# Patient Record
Sex: Female | Born: 1985 | Race: Asian | Hispanic: No | Marital: Married | Smoking: Never smoker
Health system: Southern US, Community
[De-identification: ages and names within clinical notes are randomized; demographics above are authoritative.]

## PROBLEM LIST (undated history)

## (undated) DIAGNOSIS — N83209 Unspecified ovarian cyst, unspecified side: Secondary | ICD-10-CM

## (undated) DIAGNOSIS — F419 Anxiety disorder, unspecified: Secondary | ICD-10-CM

## (undated) HISTORY — PX: NO PAST SURGERIES: SHX2092

## (undated) HISTORY — DX: Unspecified ovarian cyst, unspecified side: N83.209

---
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
## 2007-08-22 ENCOUNTER — Emergency Department (HOSPITAL_COMMUNITY): Admission: EM | Admit: 2007-08-22 | Discharge: 2007-08-22 | Payer: Self-pay | Admitting: Emergency Medicine

## 2010-05-21 ENCOUNTER — Inpatient Hospital Stay (HOSPITAL_COMMUNITY)
Admission: AD | Admit: 2010-05-21 | Discharge: 2010-05-21 | Disposition: A | Discharge status: Home / Self Care | Payer: Medicaid Other | Source: Ambulatory Visit | Attending: Obstetrics and Gynecology | Admitting: Obstetrics and Gynecology

## 2010-05-21 DIAGNOSIS — O4700 False labor before 37 completed weeks of gestation, unspecified trimester: Secondary | ICD-10-CM | POA: Insufficient documentation

## 2010-05-21 DIAGNOSIS — R109 Unspecified abdominal pain: Secondary | ICD-10-CM | POA: Insufficient documentation

## 2010-05-21 LAB — URINALYSIS, ROUTINE W REFLEX MICROSCOPIC
Glucose, UA: NEGATIVE mg/dL
Ketones, ur: NEGATIVE mg/dL
Nitrite: NEGATIVE
Specific Gravity, Urine: 1.02 (ref 1.005–1.030)
pH: 7 (ref 5.0–8.0)

## 2010-06-18 ENCOUNTER — Inpatient Hospital Stay (HOSPITAL_COMMUNITY)
Admission: AD | Admit: 2010-06-18 | Discharge: 2010-06-20 | DRG: 775 | Disposition: A | Discharge status: Home / Self Care | Payer: Medicaid Other | Source: Ambulatory Visit | Attending: Obstetrics and Gynecology | Admitting: Obstetrics and Gynecology

## 2010-06-18 DIAGNOSIS — Z22330 Carrier of Group B streptococcus: Secondary | ICD-10-CM

## 2010-06-18 DIAGNOSIS — O99892 Other specified diseases and conditions complicating childbirth: Secondary | ICD-10-CM | POA: Diagnosis present

## 2010-06-18 LAB — CBC
HCT: 36.3 % (ref 36.0–46.0)
Hemoglobin: 12.2 g/dL (ref 12.0–15.0)
MCHC: 33.6 g/dL (ref 30.0–36.0)
RBC: 4.11 MIL/uL (ref 3.87–5.11)
WBC: 10.9 10*3/uL — ABNORMAL HIGH (ref 4.0–10.5)

## 2010-06-19 LAB — RPR: RPR Ser Ql: NONREACTIVE

## 2010-06-20 LAB — CBC
HCT: 31.7 % — ABNORMAL LOW (ref 36.0–46.0)
RBC: 3.59 MIL/uL — ABNORMAL LOW (ref 3.87–5.11)
RDW: 13.6 % (ref 11.5–15.5)
WBC: 11.3 10*3/uL — ABNORMAL HIGH (ref 4.0–10.5)

## 2010-06-25 NOTE — Discharge Summary (Signed)
  NAME:  Jamie Ponce, Jamie Ponce                  ACCOUNT NO.:  [id number]  MEDICAL RECORD NO.:  [id number]           PATIENT TYPE:  I  LOCATION:  9146                          FACILITY:  [facility]  PHYSICIAN:  Huel Cote, M.D. DATE OF BIRTH:  [birth year]  DATE OF ADMISSION:  06/18/2010 DATE OF DISCHARGE:  06/20/2010                              DISCHARGE SUMMARY   DISCHARGE DIAGNOSES: 1. Term pregnancy at 38-3/7 weeks delivered. 2. Status post normal spontaneous vaginal delivery. 3. Group B strep positive status treated in labor.  DISCHARGE FOLLOWUP:  The patient is to follow up in the office in 6 weeks for her full postpartum exam.  DISCHARGE MEDICATIONS: 1. Motrin 600 mg p.o. every 6 hours. 2. Prenatal vitamins 1 p.o. daily.  HOSPITAL COURSE:  The patient is a 25 year old G2, P1-0-0-1 who came in at 38-3/[redacted] weeks gestation with a complaint of contraction and cervical change from 3 cm to 5 cm throughout the day today.  She had no rupture of membranes and prenatal care was significant for transfer Claris Gower to [place] in her third trimester.  She also had a positive group B strep status.  Prenatal labs are as follows:  AB+, antibody negative, rubella immune, hepatitis B surface antigen negative, HIV negative, RPR nonreactive, hemoglobin AA, cystic fibrosis negative, quad screen negative, 1-hour Glucola 110, group B strep positive.  PAST OBSTETRICAL HISTORY:  In 2006 she had a vaginal delivery of an 8 pound infant.  PAST MEDICAL HISTORY:  None.  PAST SURGICAL HISTORY:  None.  PAST GYN HISTORY:  No abnormal Pap smears.  ALLERGIES:  None.  MEDICATIONS:  Prenatal vitamins.  She is single with the father of the baby involved.  Does not use tobacco, alcohol, or drugs.  On admission she was afebrile with stable vital signs.  Cervix was 75 to 6 cm in -1 station.  After she received penicillin for her group B strep positive status, she had rupture of membranes with clear fluid noted. She then  progressed on to complete dilation and pushed well with a normal spontaneous vaginal delivery of a vigorous female infant over a first-degree laceration.  Nuchal cord was delivered through.  Apgar's were 8 and 9, weight 6 pounds 15 ounces.  Placenta delivery was spontaneous.  First-degree laceration was repaired with 3-0 Vicryl for hemostasis.  Cervix and rectum were intact.  She was admitted for routine postpartum care.  On postpartum day #1, hemoglobin was 10.4. She was doing well and taking Motrin for her pain and having no issues. Fundus was firm.  Bleeding was normal.  She requested early discharge and this was granted as baby was doing well and the pediatricians agreed.  She then was given instructions on pelvic rest and to follow up in the office in 6 weeks. She plans to do the circumcision in the office.     Huel Cote, M.D.     KR/MEDQ  D:  06/20/2010  T:  06/20/2010  Job:  [id number]  Electronically Signed by Huel Cote M.D. on 06/25/2010 08:50:59 AM

## 2010-06-29 NOTE — Op Note (Signed)
   NAME:  Jamie Ponce, Jamie Ponce                         ACCOUNT NO.:  [id number]   MEDICAL RECORD NO.:  [id number]                   PATIENT TYPE:  INP   LOCATION:  9133                                 [facility]ACILITY:  [facility]   PHYSICIAN:  Georgina Peer, M.D.              DATE OF BIRTH:  1985/07/24   DATE OF PROCEDURE:  11/06/2001  DATE OF DISCHARGE:                                 OPERATIVE REPORT   DIAGNOSES:  Maternal fatigue, third stage, vertex crowning.   POST DELIVERY DIAGNOSES:  Maternal fatigue, third stage, vertex crowning,  second degree episiotomy, third degree laceration, and mild postpartum  hemorrhage.   OPERATION:  Operative delivery by KIWI vacuum extraction.  Delivered viable  female infant at 6:13 a.m.  Apgars of 9 and 9.   ANESTHESIA:  Epidural.   INDICATIONS:  A 25 year old primigravida presented in labor.  Became  completely dilated at 3:45 and began pushing.  Perineum became edematous as  well did the periurethral area and the patient became fatigued and was not  making any more progress.  The vertex could be seen about crowning about .50  pea size.  Vacuum extraction was offered and accepted.  Risks and  complications including caput accentuation, scalp laceration,  cephalohematoma, and intracranial hemorrhage all discussed and accepted.  KIWI vacuum was applied and within three contractions a vaginal delivery was  accomplished over a second degree midline episiotomy.  At 6:13 a viable female  infant was delivered.  Mouth and nose were suctioned.  Infant cried  spontaneously and Apgars were 9 and 9.  Placenta spontaneously delivered at  6:17.  There was noted to be a third degree extension, but no extension of  the rectal mucosa.  The perineum was repaired in layers with four mattress  sutures placed circumferentially around the anal sphincter.  There was some  mild uterine atone which responded to Pitocin, massage, and Methergine.  Blood loss was estimated at 500 cc.  The  patient was stable, so was the  infant postpartum.  Sponge, needle, and instrument counts were correct.                                               Georgina Peer, M.D.    JPN/MEDQ  D:  11/06/2001  T:  11/06/2001  Job:  [id number]

## 2013-02-12 ENCOUNTER — Ambulatory Visit: Payer: Self-pay

## 2013-02-12 ENCOUNTER — Encounter (HOSPITAL_COMMUNITY): Payer: Self-pay | Admitting: Emergency Medicine

## 2013-02-12 ENCOUNTER — Emergency Department (HOSPITAL_COMMUNITY)
Admission: EM | Admit: 2013-02-12 | Discharge: 2013-02-12 | Disposition: A | Discharge status: Home / Self Care | Payer: Medicaid Other | Attending: Emergency Medicine | Admitting: Emergency Medicine

## 2013-02-12 DIAGNOSIS — R1030 Lower abdominal pain, unspecified: Secondary | ICD-10-CM

## 2013-02-12 DIAGNOSIS — R300 Dysuria: Secondary | ICD-10-CM | POA: Insufficient documentation

## 2013-02-12 DIAGNOSIS — R1032 Left lower quadrant pain: Secondary | ICD-10-CM | POA: Insufficient documentation

## 2013-02-12 DIAGNOSIS — Z3202 Encounter for pregnancy test, result negative: Secondary | ICD-10-CM | POA: Insufficient documentation

## 2013-02-12 DIAGNOSIS — N949 Unspecified condition associated with female genital organs and menstrual cycle: Secondary | ICD-10-CM | POA: Insufficient documentation

## 2013-02-12 LAB — URINALYSIS, ROUTINE W REFLEX MICROSCOPIC
Bilirubin Urine: NEGATIVE
Glucose, UA: NEGATIVE mg/dL
Hgb urine dipstick: NEGATIVE
Ketones, ur: NEGATIVE mg/dL
Leukocytes, UA: NEGATIVE
Nitrite: NEGATIVE
Protein, ur: NEGATIVE mg/dL
Specific Gravity, Urine: 1.011 (ref 1.005–1.030)
Urobilinogen, UA: 0.2 mg/dL (ref 0.0–1.0)
pH: 7.5 (ref 5.0–8.0)

## 2013-02-12 LAB — WET PREP, GENITAL
Clue Cells Wet Prep HPF POC: NONE SEEN
Trich, Wet Prep: NONE SEEN
Yeast Wet Prep HPF POC: NONE SEEN

## 2013-02-12 LAB — PREGNANCY, URINE: Preg Test, Ur: NEGATIVE

## 2013-02-12 MED ORDER — TRAMADOL HCL 50 MG PO TABS: 50.0000 mg | ORAL_TABLET | Freq: Four times a day (QID) | ORAL | Status: DC | PRN

## 2013-02-12 NOTE — Progress Notes (Signed)
   CARE MANAGEMENT ED NOTE 02/12/2013  Patient:  Jamie Ponce,Jamie Ponce   Account Number:  [id number]  Date Initiated:  02/12/2013  Documentation initiated by:  Radford PaxFERRERO,Jahmil Macleod  Subjective/Objective Assessment:   Patient presents to ED with suprapubic pain.     Subjective/Objective Assessment Detail:     Action/Plan:   Action/Plan Detail:   Anticipated DC Date:  02/12/2013     Status Recommendation to Physician:   Result of Recommendation:    Other ED Services  Consult Working Plan    DC Planning Services  Other  Outpatient Services - Pt will follow up    Choice offered to / List presented to:            Status of service:  Completed, signed off  ED Comments:   ED Comments Detail:  EDCM spoke to patient at bedside.  Patient confirms she does not have a pcp or insurance.  [facility] provided [facility] with a list of pcps who accept self pay patients, list of discount pharmacies and website needymeds.org for medication assistance, financial assistance in the community sucha s local churches and [employer], urban ministries, information regarding Medicaid and affordable care act for insurance, and dental assistance for uninsured patients.  Patient thankful for resources.  No further CM needs at this time.

## 2013-02-12 NOTE — ED Notes (Addendum)
Pt reports pain w/ urination and lower abdominal pain with activity.   Pt is aware that we need a urine sample.

## 2013-02-12 NOTE — ED Provider Notes (Signed)
CSN: [id number]     Arrival date & time 02/12/13  1337 History   First MD Initiated Contact with Patient 02/12/13 1400     Chief Complaint  Patient presents with  . Abdominal Pain   (Consider location/radiation/quality/duration/timing/severity/associated sxs/prior Treatment) HPI Pt is a 27yo female with suprapubic pain that started yesterday. Pain is cramping and sharp in nature, intermittent, 8/10 at worst. Pain does not radiate. Reports mild dysuria and pressure sensation with urination. Has tried warm compress to lower abdomen w/o relief. Has not taken any pain medication. Denies fever, n/v/d. Denies vaginal pain or discharge. LMP: 12/20. Normal per pt. Pt is not currently sexually active. Not on birth control. No hx of abdominal surgeries.   History reviewed. No pertinent past medical history. History reviewed. No pertinent past surgical history. No family history on file. History  Substance Use Topics  . Smoking status: Never Smoker   . Smokeless tobacco: Not on file  . Alcohol Use: No   OB History   Grav Para Term Preterm Abortions TAB SAB Ect Mult Living                 Review of Systems  Constitutional: Negative for fever and chills.  Gastrointestinal: Positive for abdominal pain ( lower abdomen). Negative for nausea, vomiting and diarrhea.  Genitourinary: Positive for dysuria and pelvic pain. Negative for urgency, hematuria, flank pain, decreased urine volume, vaginal bleeding, vaginal discharge, genital sores and vaginal pain.  All other systems reviewed and are negative.    Allergies  Review of patient's allergies indicates no known allergies.  Home Medications   Current Outpatient Rx  Name  Route  Sig  Dispense  Refill  . traMADol (ULTRAM) 50 MG tablet   Oral   Take 1 tablet (50 mg total) by mouth every 6 (six) hours as needed.   15 tablet   0    BP 103/60  Pulse 66  Temp(Src) 98.4 F (36.9 C) (Oral)  SpO2 98%  LMP 01/30/2013 Physical Exam  Nursing  note and vitals reviewed. Constitutional: She appears well-developed and well-nourished. No distress.  Pt lying comfortably in exam bed, NAD.   HENT:  Head: Normocephalic and atraumatic.  Eyes: Conjunctivae are normal. No scleral icterus.  Neck: Normal range of motion.  Cardiovascular: Normal rate, regular rhythm and normal heart sounds.   Pulmonary/Chest: Effort normal and breath sounds normal. No respiratory distress. She has no wheezes. She has no rales. She exhibits no tenderness.  Abdominal: Soft. Bowel sounds are normal. She exhibits no distension and no mass. There is tenderness (suprapubic and LLQ). There is no rebound and no guarding.    Genitourinary: Uterus normal. No labial fusion. There is no rash, tenderness, lesion or injury on the right labia. There is no rash, tenderness, lesion or injury on the left labia. Cervix exhibits no motion tenderness, no discharge and no friability. Right adnexum displays no mass, no tenderness and no fullness. Left adnexum displays no mass, no tenderness and no fullness. There is bleeding ( scant red blood) around the vagina. No erythema or tenderness around the vagina. No foreign body around the vagina. No signs of injury around the vagina. No vaginal discharge found.  Scant red blood in vaginal canal. No CMT, adnexal tenderness or masses.  Musculoskeletal: Normal range of motion.  Neurological: She is alert.  Skin: Skin is warm and dry. She is not diaphoretic.    ED Course  Procedures (including critical care time) Labs Review Labs Reviewed  WET  PREP, GENITAL - Abnormal; Notable for the following:    WBC, Wet Prep HPF POC FEW (*)    All other components within normal limits  GC/CHLAMYDIA PROBE AMP  URINALYSIS, ROUTINE W REFLEX MICROSCOPIC  PREGNANCY, URINE   Imaging Review No results found.  EKG Interpretation   None       MDM   1. Lower abdominal pain    Pt c/o lower abdominal pain. On exam, pt appears well, non-toxic. NAD.  Abd: soft, nondistended, mild tenderness in pelvic, LLQ region.  No rebound or guarding. No masses. Pelvic: scant red blood in vaginal canal. No CMT, adnexal tenderness or masses.  Pt declined pain medication in ED.  UA: WNL Urine preg: negative Wet prep: unremarkable.  Not concerned for emergent process taking place at this time.  Discussed findings with pt. [facility] and tx of symptoms. Return precautions provided. Rx: tramadol. Pt verbalized understanding and agreement with tx plan.    Junius FinnerErin O'Malley, PA-C 02/12/13 [phone]

## 2013-02-12 NOTE — ED Notes (Signed)
Pelvic setup in room 

## 2013-02-12 NOTE — ED Notes (Signed)
Pt c/o lower abd pain that "when i walk feels like something is poking me".  Pt denies n/v/d.

## 2013-02-14 LAB — GC/CHLAMYDIA PROBE AMP
CT Probe RNA: NEGATIVE
GC Probe RNA: NEGATIVE

## 2013-02-15 NOTE — ED Provider Notes (Signed)
Medical screening examination/treatment/procedure(s) were performed by non-physician practitioner and as supervising physician I was immediately available for consultation/collaboration.  EKG Interpretation   None         Estelita Iten L Mishayla Sliwinski, MD 02/15/13 0735 

## 2013-02-16 ENCOUNTER — Inpatient Hospital Stay (HOSPITAL_COMMUNITY)
Admission: AD | Admit: 2013-02-16 | Discharge: 2013-02-16 | Disposition: A | Discharge status: Home / Self Care | Payer: Medicaid Other | Source: Ambulatory Visit | Attending: Obstetrics and Gynecology | Admitting: Obstetrics and Gynecology

## 2013-02-16 ENCOUNTER — Encounter (HOSPITAL_COMMUNITY): Payer: Self-pay | Admitting: *Deleted

## 2013-02-16 ENCOUNTER — Inpatient Hospital Stay (HOSPITAL_COMMUNITY): Payer: Medicaid Other

## 2013-02-16 DIAGNOSIS — N83209 Unspecified ovarian cyst, unspecified side: Secondary | ICD-10-CM

## 2013-02-16 DIAGNOSIS — N83201 Unspecified ovarian cyst, right side: Secondary | ICD-10-CM

## 2013-02-16 DIAGNOSIS — R42 Dizziness and giddiness: Secondary | ICD-10-CM | POA: Insufficient documentation

## 2013-02-16 DIAGNOSIS — R109 Unspecified abdominal pain: Secondary | ICD-10-CM | POA: Insufficient documentation

## 2013-02-16 LAB — URINALYSIS, ROUTINE W REFLEX MICROSCOPIC
BILIRUBIN URINE: NEGATIVE
Glucose, UA: NEGATIVE mg/dL
KETONES UR: 15 mg/dL — AB
LEUKOCYTES UA: NEGATIVE
NITRITE: NEGATIVE
PH: 6 (ref 5.0–8.0)
PROTEIN: NEGATIVE mg/dL
Specific Gravity, Urine: 1.025 (ref 1.005–1.030)
UROBILINOGEN UA: 0.2 mg/dL (ref 0.0–1.0)

## 2013-02-16 LAB — CBC
HCT: 35.1 % — ABNORMAL LOW (ref 36.0–46.0)
HEMOGLOBIN: 12.1 g/dL (ref 12.0–15.0)
MCH: 29.6 pg (ref 26.0–34.0)
MCHC: 34.5 g/dL (ref 30.0–36.0)
MCV: 85.8 fL (ref 78.0–100.0)
PLATELETS: 199 10*3/uL (ref 150–400)
RBC: 4.09 MIL/uL (ref 3.87–5.11)
RDW: 12.2 % (ref 11.5–15.5)
WBC: 6.5 10*3/uL (ref 4.0–10.5)

## 2013-02-16 LAB — URINE MICROSCOPIC-ADD ON

## 2013-02-16 LAB — POCT PREGNANCY, URINE: PREG TEST UR: NEGATIVE

## 2013-02-16 MED ORDER — IBUPROFEN 600 MG PO TABS: 600.0000 mg | ORAL_TABLET | Freq: Four times a day (QID) | ORAL | Status: DC | PRN

## 2013-02-16 MED ORDER — KETOROLAC TROMETHAMINE 60 MG/2ML IM SOLN
60.0000 mg | Freq: Once | INTRAMUSCULAR | Status: AC
  Administered 2013-02-16: 60 mg via INTRAMUSCULAR
  Filled 2013-02-16: qty 2

## 2013-02-16 MED ORDER — HYDROCODONE-ACETAMINOPHEN 5-325 MG PO TABS: 1.0000 | ORAL_TABLET | ORAL | Status: DC | PRN

## 2013-02-16 NOTE — MAU Note (Signed)
Patient states she has had lower abdominal pain since 1-1. [facility] Patient states she called and could not be seen until February and states she continues to have pain. States pain medication is not working. Denies bleeding or discharge.

## 2013-02-16 NOTE — MAU Provider Note (Signed)
History     CSN: [id number]  Arrival date and time: 02/16/13 1016   First Provider Initiated Contact with Patient 02/16/13 1448      Chief Complaint  Patient presents with  . Abdominal Pain   HPI  [name] is a 28 y.o. female G15P2002. Pt was seen at [facility] ED on 02/13/2012; was referred to [facility]. Pt called to schedule an appointment in the clinic and was told she would not be seen until February. She presents today with abdominal pain that is worsened with walking. She is also experiencing dizziness, nausea and "feeling hot".  The pain first started on 02/13/12 and has gotten a little better, however has not gone away. Pain is described at sharp, mid-left abdominal pain that is worse when walking. She has not taken anything for pain today; she currently rates her pain 8 when she is walking.   OB History   Grav Para Term Preterm Abortions TAB SAB Ect Mult Living   2 2 2       2       History reviewed. No pertinent past medical history.  Past Surgical History  Procedure Laterality Date  . Vaginal delivery      X 2    Family History  Problem Relation Age of Onset  . Heart disease Mother   . Diabetes Father     History  Substance Use Topics  . Smoking status: Never Smoker   . Smokeless tobacco: Never Used  . Alcohol Use: No    Allergies: No Known Allergies  Prescriptions prior to admission  Medication Sig Dispense Refill  . traMADol (ULTRAM) 50 MG tablet Take 1 tablet (50 mg total) by mouth every 6 (six) hours as needed.  15 tablet  0   Results for orders placed during the hospital encounter of 02/16/13 (from the past 48 hour(s))  URINALYSIS, ROUTINE W REFLEX MICROSCOPIC     Status: Abnormal   Collection Time    02/16/13  2:10 PM      Result Value Range   Color, Urine YELLOW  YELLOW   APPearance CLEAR  CLEAR   Specific Gravity, Urine 1.025  1.005 - 1.030   pH 6.0  5.0 - 8.0   Glucose, UA NEGATIVE  NEGATIVE mg/dL   Hgb urine dipstick TRACE (*)  NEGATIVE   Bilirubin Urine NEGATIVE  NEGATIVE   Ketones, ur 15 (*) NEGATIVE mg/dL   Protein, ur NEGATIVE  NEGATIVE mg/dL   Urobilinogen, UA 0.2  0.0 - 1.0 mg/dL   Nitrite NEGATIVE  NEGATIVE   Leukocytes, UA NEGATIVE  NEGATIVE  URINE MICROSCOPIC-ADD ON     Status: None   Collection Time    02/16/13  2:10 PM      Result Value Range   Squamous Epithelial / LPF RARE  RARE   WBC, UA 0-2  <3 WBC/hpf   RBC / HPF 0-2  <3 RBC/hpf  CBC     Status: Abnormal   Collection Time    02/16/13  2:48 PM      Result Value Range   WBC 6.5  4.0 - 10.5 K/uL   RBC 4.09  3.87 - 5.11 MIL/uL   Hemoglobin 12.1  12.0 - 15.0 g/dL   HCT 40.9 (*) 81.1 - 91.4 %   MCV 85.8  78.0 - 100.0 fL   MCH 29.6  26.0 - 34.0 pg   MCHC 34.5  30.0 - 36.0 g/dL   RDW 78.2  95.6 - 21.3 %  Platelets 199  150 - 400 K/uL  POCT PREGNANCY, URINE     Status: None   Collection Time    02/16/13  2:52 PM      Result Value Range   Preg Test, Ur NEGATIVE  NEGATIVE   Comment:            THE SENSITIVITY OF THIS     METHODOLOGY IS >24 mIU/mL   US Transvaginal Non-ob  02/16/2013   CLINICAL DATA:  Severe lower abdominal pain. Clinical suspicion for ovarian torsion.  EXAM: TRANSABDOMINAL AND TRANSVAGINAL ULTRASOUND OF PELVIS  DOPPLER ULTRASOUND OF OVARIES  TECHNIQUE: Both transabdominal and transvaginal ultrasound examinations of the pelvis were performed. Transabdominal technique was performed for global imaging of the pelvis including uterus, ovaries, adnexal regions, and pelvic cul-de-sac.  It was necessary to proceed with endovaginal exam following the transabdominal exam to visualize the endometrium and ovaries. Color and duplex Doppler ultrasound was utilized to evaluate blood flow to the ovaries.  COMPARISON:  None.  FINDINGS: Uterus  Measurements: 6.9 x 3.7 x 4.6 cm. Retroflexed. No fibroids or other mass visualized.  Endometrium  Thickness: 12 mm.  No focal abnormality visualized.  Right ovary  Measurements: 4.2 x 2.2 x 4.8 cm. A 2 cm  corpus luteum noted. Otherwise normal appearance/no adnexal mass.  Left ovary  Measurements: 3.1 x 1.9 x 1.1 cm. Normal appearance/no adnexal mass.  Pulsed Doppler evaluation of both ovaries demonstrates normal low-resistance arterial and venous waveforms.  Other findings  Small amount of complex fluid noted in cul-de-sac and right adnexa.  IMPRESSION: Small right ovarian corpus luteum and small amount of free fluid, likely physiologic.  No sonographic evidence for ovarian torsion.   Electronically Signed   By: Myles Rosenthal M.D.   On: 02/16/2013 16:20   US Pelvis Complete  02/16/2013   CLINICAL DATA:  Severe lower abdominal pain. Clinical suspicion for ovarian torsion.  EXAM: TRANSABDOMINAL AND TRANSVAGINAL ULTRASOUND OF PELVIS  DOPPLER ULTRASOUND OF OVARIES  TECHNIQUE: Both transabdominal and transvaginal ultrasound examinations of the pelvis were performed. Transabdominal technique was performed for global imaging of the pelvis including uterus, ovaries, adnexal regions, and pelvic cul-de-sac.  It was necessary to proceed with endovaginal exam following the transabdominal exam to visualize the endometrium and ovaries. Color and duplex Doppler ultrasound was utilized to evaluate blood flow to the ovaries.  COMPARISON:  None.  FINDINGS: Uterus  Measurements: 6.9 x 3.7 x 4.6 cm. Retroflexed. No fibroids or other mass visualized.  Endometrium  Thickness: 12 mm.  No focal abnormality visualized.  Right ovary  Measurements: 4.2 x 2.2 x 4.8 cm. A 2 cm corpus luteum noted. Otherwise normal appearance/no adnexal mass.  Left ovary  Measurements: 3.1 x 1.9 x 1.1 cm. Normal appearance/no adnexal mass.  Pulsed Doppler evaluation of both ovaries demonstrates normal low-resistance arterial and venous waveforms.  Other findings  Small amount of complex fluid noted in cul-de-sac and right adnexa.  IMPRESSION: Small right ovarian corpus luteum and small amount of free fluid, likely physiologic.  No sonographic evidence for ovarian  torsion.   Electronically Signed   By: Myles Rosenthal M.D.   On: 02/16/2013 16:20   [place] Art/ven Flow Abd Pelv Doppler  02/16/2013   CLINICAL DATA:  Severe lower abdominal pain. Clinical suspicion for ovarian torsion.  EXAM: TRANSABDOMINAL AND TRANSVAGINAL ULTRASOUND OF PELVIS  DOPPLER ULTRASOUND OF OVARIES  TECHNIQUE: Both transabdominal and transvaginal ultrasound examinations of the pelvis were performed. Transabdominal technique was performed for global imaging of  the pelvis including uterus, ovaries, adnexal regions, and pelvic cul-de-sac.  It was necessary to proceed with endovaginal exam following the transabdominal exam to visualize the endometrium and ovaries. Color and duplex Doppler ultrasound was utilized to evaluate blood flow to the ovaries.  COMPARISON:  None.  FINDINGS: Uterus  Measurements: 6.9 x 3.7 x 4.6 cm. Retroflexed. No fibroids or other mass visualized.  Endometrium  Thickness: 12 mm.  No focal abnormality visualized.  Right ovary  Measurements: 4.2 x 2.2 x 4.8 cm. A 2 cm corpus luteum noted. Otherwise normal appearance/no adnexal mass.  Left ovary  Measurements: 3.1 x 1.9 x 1.1 cm. Normal appearance/no adnexal mass.  Pulsed Doppler evaluation of both ovaries demonstrates normal low-resistance arterial and venous waveforms.  Other findings  Small amount of complex fluid noted in cul-de-sac and right adnexa.  IMPRESSION: Small right ovarian corpus luteum and small amount of free fluid, likely physiologic.  No sonographic evidence for ovarian torsion.   Electronically Signed   By: Myles RosenthalJohn  Stahl M.D.   On: 02/16/2013 16:20    Review of Systems  Constitutional: Positive for chills. Negative for fever.  Gastrointestinal: Positive for nausea, abdominal pain and diarrhea. Negative for vomiting.       Yesterday patient had 2 episodes of diarrhea.   Genitourinary: Negative for dysuria, urgency, frequency and hematuria.  Musculoskeletal: Positive for back pain.   Physical Exam   Temperature 98.7  F (37.1 C), height 4\' 10"  (1.473 m), weight 46.448 kg (102 lb 6.4 oz), last menstrual period 01/23/2013.  Physical Exam  Constitutional: She is oriented to person, place, and time. She appears well-developed and well-nourished. No distress.  HENT:  Head: Normocephalic.  Eyes: Pupils are equal, round, and reactive to light.  Neck: Neck supple.  Respiratory: Effort normal.  GI: Soft. She exhibits no distension. There is tenderness in the right upper quadrant and right lower quadrant. There is no rebound and no guarding.  Genitourinary:  Bimanual exam: Cervix closed, Mild CMT  Uterus non tender, slightly enlarged uterus  Bilateral adnexal tenderness, + suprapubic tenderness no masses bilaterally Chaperone present for exam.   Musculoskeletal: Normal range of motion.  Neurological: She is alert and oriented to person, place, and time.  Skin: Skin is warm. She is not diaphoretic.    MAU Course  Procedures None  MDM CBC  Toradol 60 mg IM Pelvic US UA   Assessment and Plan   A:  1. Ovarian cyst, right   2. Abdominal pain, acute    P: Discharge home in stable condition RX: vicodin        Ibuprofen Return to MAU with worsening symptoms  Pt is to follow up in the clinic as planned   Iona HansenJennifer Irene Azha Constantin, NP  02/16/2013, 8:45 PM

## 2013-02-17 NOTE — MAU Provider Note (Signed)
Attestation of Attending Supervision of Advanced Practitioner (CNM/NP): Evaluation and management procedures were performed by the Advanced Practitioner under my supervision and collaboration.  I have reviewed the Advanced Practitioner's note and chart, and I agree with the management and plan.  Lonnell Chaput 02/17/2013 7:30 AM   

## 2013-03-08 ENCOUNTER — Encounter: Payer: Self-pay | Admitting: Obstetrics & Gynecology

## 2013-03-08 ENCOUNTER — Ambulatory Visit (INDEPENDENT_AMBULATORY_CARE_PROVIDER_SITE_OTHER): Payer: Self-pay | Admitting: Obstetrics & Gynecology

## 2013-03-08 VITALS — BP 104/67 | HR 64 | Temp 97.8°F | Ht 59.0 in | Wt 106.8 lb

## 2013-03-08 DIAGNOSIS — N83209 Unspecified ovarian cyst, unspecified side: Secondary | ICD-10-CM

## 2013-03-08 NOTE — Progress Notes (Signed)
Subjective:     Patient ID: Jamie Ponce, female   DOB: 16-Sep-1985, 28 y.o.   MRN: [id number]  HPI Pt was seen in the MAU earlier this month with c/o pain.  She was diagnosed with an ovarian cyst. She reports that her pain has improved since her diagnosis.  She was occ taking pain meds but, has not needed them recently.  Pt has monthly cycles.  She is not sexually active. She denies menorrhagia or abnormal discharge.  Past Medical History  Diagnosis Date  . Ovarian cyst    Past Surgical History  Procedure Laterality Date  . Vaginal delivery      X 2   Current Outpatient Prescriptions on File Prior to Visit  Medication Sig Dispense Refill  . cetirizine (ZYRTEC) 10 MG tablet Take 10 mg by mouth daily as needed for allergies.      Marland Kitchen HYDROcodone-acetaminophen (NORCO/VICODIN) 5-325 MG per tablet Take 1 tablet by mouth every 4 (four) hours as needed.  10 tablet  0  . ibuprofen (ADVIL,MOTRIN) 600 MG tablet Take 1 tablet (600 mg total) by mouth every 6 (six) hours as needed.  30 tablet  0  . traMADol (ULTRAM) 50 MG tablet Take 1 tablet (50 mg total) by mouth every 6 (six) hours as needed.  15 tablet  0   No current facility-administered medications on file prior to visit.  No Known Allergies History   Social History  . Marital Status: Single    Spouse Name: N/A    Number of Children: N/A  . Years of Education: N/A   Occupational History  . Not on file.   Social History Main Topics  . Smoking status: Never Smoker   . Smokeless tobacco: Never Used  . Alcohol Use: No  . Drug Use: No  . Sexual Activity: Not Currently    Birth Control/ Protection: None     Comment: Last intercourse 2-3 wks ago   Other Topics Concern  . Not on file   Social History Narrative  . No narrative on file        Review of Systems     Objective:   Physical Exam BP 104/67  Pulse 64  Temp(Src) 97.8 F (36.6 C)  Ht 4\' 11"  (1.499 m)  Wt 106 lb 12.8 oz (48.444 kg)  BMI 21.56 kg/m2  LMP  02/21/2013 Pt in NAD Abd: soft, NT, ND GU: EGBUS: no lesions Vagina: no blood in vault Cervix: no lesion; no mucopurulent d/c; no CMT Uterus: small, mobile Adnexa: no masses; non tender       02/16/2013  CLINICAL DATA: Severe lower abdominal pain. Clinical suspicion for  ovarian torsion.  EXAM:  TRANSABDOMINAL AND TRANSVAGINAL ULTRASOUND OF PELVIS  DOPPLER ULTRASOUND OF OVARIES  TECHNIQUE:  Both transabdominal and transvaginal ultrasound examinations of the  pelvis were performed. Transabdominal technique was performed for  global imaging of the pelvis including uterus, ovaries, adnexal  regions, and pelvic cul-de-sac.  It was necessary to proceed with endovaginal exam following the  transabdominal exam to visualize the endometrium and ovaries. Color  and duplex Doppler ultrasound was utilized to evaluate blood flow to  the ovaries.  COMPARISON: None.  FINDINGS:  Uterus  Measurements: 6.9 x 3.7 x 4.6 cm. Retroflexed. No fibroids or other  mass visualized.  Endometrium  Thickness: 12 mm. No focal abnormality visualized.  Right ovary  Measurements: 4.2 x 2.2 x 4.8 cm. A 2 cm corpus luteum noted.  Otherwise normal appearance/no adnexal mass.  Left ovary  Measurements: 3.1 x 1.9 x 1.1 cm. Normal appearance/no adnexal mass.  Pulsed Doppler evaluation of both ovaries demonstrates normal  low-resistance arterial and venous waveforms.  Other findings  Small amount of complex fluid noted in cul-de-sac and right adnexa.  IMPRESSION:  Small right ovarian corpus luteum and small amount of free fluid,  likely physiologic.  No sonographic evidence for ovarian torsion.       Assessment:     Ovarian cyst     Plan:     F/u prn

## 2013-03-08 NOTE — Patient Instructions (Signed)

## 2013-05-31 ENCOUNTER — Encounter (HOSPITAL_COMMUNITY): Payer: Self-pay | Admitting: Emergency Medicine

## 2013-05-31 ENCOUNTER — Emergency Department (HOSPITAL_COMMUNITY)
Admission: EM | Admit: 2013-05-31 | Discharge: 2013-05-31 | Disposition: A | Discharge status: Home / Self Care | Payer: Medicaid Other | Attending: Emergency Medicine | Admitting: Emergency Medicine

## 2013-05-31 ENCOUNTER — Emergency Department (HOSPITAL_COMMUNITY): Payer: Medicaid Other

## 2013-05-31 DIAGNOSIS — N838 Other noninflammatory disorders of ovary, fallopian tube and broad ligament: Secondary | ICD-10-CM | POA: Insufficient documentation

## 2013-05-31 DIAGNOSIS — R109 Unspecified abdominal pain: Secondary | ICD-10-CM | POA: Diagnosis present

## 2013-05-31 DIAGNOSIS — Z3202 Encounter for pregnancy test, result negative: Secondary | ICD-10-CM | POA: Insufficient documentation

## 2013-05-31 DIAGNOSIS — N83201 Unspecified ovarian cyst, right side: Secondary | ICD-10-CM

## 2013-05-31 DIAGNOSIS — R102 Pelvic and perineal pain: Secondary | ICD-10-CM

## 2013-05-31 LAB — URINALYSIS, ROUTINE W REFLEX MICROSCOPIC
Bilirubin Urine: NEGATIVE
Glucose, UA: NEGATIVE mg/dL
Ketones, ur: NEGATIVE mg/dL
LEUKOCYTES UA: NEGATIVE
Nitrite: NEGATIVE
Protein, ur: NEGATIVE mg/dL
Specific Gravity, Urine: 1.025 (ref 1.005–1.030)
UROBILINOGEN UA: 1 mg/dL (ref 0.0–1.0)
pH: 7.5 (ref 5.0–8.0)

## 2013-05-31 LAB — URINE MICROSCOPIC-ADD ON

## 2013-05-31 LAB — WET PREP, GENITAL
Clue Cells Wet Prep HPF POC: NONE SEEN
Trich, Wet Prep: NONE SEEN
Yeast Wet Prep HPF POC: NONE SEEN

## 2013-05-31 LAB — CBC WITH DIFFERENTIAL/PLATELET
BASOS ABS: 0 10*3/uL (ref 0.0–0.1)
BASOS PCT: 0 % (ref 0–1)
Eosinophils Absolute: 0.2 10*3/uL (ref 0.0–0.7)
Eosinophils Relative: 2 % (ref 0–5)
HCT: 40.2 % (ref 36.0–46.0)
HEMOGLOBIN: 13.6 g/dL (ref 12.0–15.0)
Lymphocytes Relative: 22 % (ref 12–46)
Lymphs Abs: 2.1 10*3/uL (ref 0.7–4.0)
MCH: 30 pg (ref 26.0–34.0)
MCHC: 33.8 g/dL (ref 30.0–36.0)
MCV: 88.7 fL (ref 78.0–100.0)
MONOS PCT: 6 % (ref 3–12)
Monocytes Absolute: 0.5 10*3/uL (ref 0.1–1.0)
NEUTROS ABS: 6.7 10*3/uL (ref 1.7–7.7)
NEUTROS PCT: 70 % (ref 43–77)
Platelets: 196 10*3/uL (ref 150–400)
RBC: 4.53 MIL/uL (ref 3.87–5.11)
RDW: 12.3 % (ref 11.5–15.5)
WBC: 9.5 10*3/uL (ref 4.0–10.5)

## 2013-05-31 LAB — I-STAT CHEM 8, ED
BUN: 16 mg/dL (ref 6–23)
CHLORIDE: 102 meq/L (ref 96–112)
Calcium, Ion: 1.2 mmol/L (ref 1.12–1.23)
Creatinine, Ser: 0.7 mg/dL (ref 0.50–1.10)
GLUCOSE: 86 mg/dL (ref 70–99)
HEMATOCRIT: 41 % (ref 36.0–46.0)
Hemoglobin: 13.9 g/dL (ref 12.0–15.0)
Potassium: 3.9 mEq/L (ref 3.7–5.3)
Sodium: 140 mEq/L (ref 137–147)
TCO2: 27 mmol/L (ref 0–100)

## 2013-05-31 LAB — POC URINE PREG, ED
PREG TEST UR: NEGATIVE
Preg Test, Ur: NEGATIVE

## 2013-05-31 MED ORDER — CEFTRIAXONE SODIUM 250 MG IJ SOLR
250.0000 mg | Freq: Once | INTRAMUSCULAR | Status: AC
  Administered 2013-05-31: 250 mg via INTRAMUSCULAR
  Filled 2013-05-31: qty 250

## 2013-05-31 MED ORDER — KETOROLAC TROMETHAMINE 30 MG/ML IJ SOLN
30.0000 mg | Freq: Once | INTRAMUSCULAR | Status: AC
  Administered 2013-05-31: 30 mg via INTRAVENOUS
  Filled 2013-05-31: qty 1

## 2013-05-31 MED ORDER — SODIUM CHLORIDE 0.9 % IV SOLN
INTRAVENOUS | Status: DC
  Administered 2013-05-31: 17:00:00 via INTRAVENOUS

## 2013-05-31 MED ORDER — HYDROCODONE-ACETAMINOPHEN 5-325 MG PO TABS: 1.0000 | ORAL_TABLET | Freq: Four times a day (QID) | ORAL | Status: DC | PRN

## 2013-05-31 MED ORDER — MORPHINE SULFATE 2 MG/ML IJ SOLN
1.0000 mg | INTRAMUSCULAR | Status: DC | PRN
  Administered 2013-05-31: 1 mg via INTRAVENOUS
  Filled 2013-05-31: qty 1

## 2013-05-31 MED ORDER — IOHEXOL 300 MG/ML  SOLN
80.0000 mL | Freq: Once | INTRAMUSCULAR | Status: AC | PRN
  Administered 2013-05-31: 80 mL via INTRAVENOUS

## 2013-05-31 MED ORDER — IOHEXOL 300 MG/ML  SOLN
25.0000 mL | INTRAMUSCULAR | Status: AC
  Administered 2013-05-31: 25 mL via ORAL

## 2013-05-31 MED ORDER — AZITHROMYCIN 250 MG PO TABS
1000.0000 mg | ORAL_TABLET | Freq: Once | ORAL | Status: AC
  Administered 2013-05-31: 1000 mg via ORAL
  Filled 2013-05-31: qty 4

## 2013-05-31 MED ORDER — HYDROCODONE-ACETAMINOPHEN 5-325 MG PO TABS
2.0000 | ORAL_TABLET | Freq: Once | ORAL | Status: AC
Start: 2013-05-31 — End: 2013-05-31
  Administered 2013-05-31: 2 via ORAL
  Filled 2013-05-31: qty 2

## 2013-05-31 MED ORDER — LIDOCAINE HCL (PF) 1 % IJ SOLN
INTRAMUSCULAR | Status: AC
  Filled 2013-05-31: qty 5

## 2013-05-31 NOTE — ED Notes (Signed)
CT notified that pt is finished with contrast.  

## 2013-05-31 NOTE — Discharge Planning (Signed)
[facility] Jamie Ponce, Community Liaison ° °Spoke to patient about primary care resources and establishing care with a provider. Resource guide and my contact information was provided for any future questions or concerns. °

## 2013-05-31 NOTE — Discharge Instructions (Signed)
Take motrin 600 mg every 6 hrs for pain.   Take vicodin for severe pain. Do NOT drive with it.   Follow up with your GYN doctor. You may need to start on birth control to help with ovarian cysts.   Return to ER if you have severe pain, vomiting.

## 2013-05-31 NOTE — ED Notes (Signed)
Patient transported to Ultrasound 

## 2013-05-31 NOTE — ED Notes (Signed)
Pt arrives via POV from home with c/o lower abdominal pain. Denies fever, nausea, vomiting. Does report diarrhea and dysuria. Pt awake, alert, ambulatory at present.

## 2013-05-31 NOTE — ED Provider Notes (Signed)
  Physical Exam  BP 100/64  Pulse 60  Temp(Src) 98.5 F (36.9 C) (Oral)  Resp 20  Wt 104 lb (47.174 kg)  SpO2 100%  LMP 05/20/2013  Physical Exam  ED Course  Procedures  Care assumed at sign out. Patient had lower pelvic pain since yesterday. US showed ovarian cyst but she was still in pain so CT ordered. Sign out pending CT. Ct showed no signs of appendicitis. Labs nl. Given ceftriaxone/azithro by Dr. Richrd PrimeMcMannus. She had ovarian cyst in January. I think her pain is from ovarian cyst. Recommend GYN f/u to start on birth control.   Richardean Canalavid H Yao, MD 05/31/13 Nicholos [id number]

## 2013-05-31 NOTE — ED Provider Notes (Signed)
CSN: [id number]     Arrival date & time 05/31/13  1153 History   First MD Initiated Contact with Patient 05/31/13 1307     Chief Complaint  Patient presents with  . Abdominal Pain      HPI Pt was seen at 1325.  Per pt, c/o gradual onset and persistence of constant lower pelvic "pain" since yesterday. Describes the abd pain as "cramping."  Has been associated with one loose stool yesterday, improved today. Denies dysuria, no vaginal bleeding/discharge, no N/V/D, no fevers, no back pain, no rash, no CP/SOB, no black or blood in stools.        Past Medical History  Diagnosis Date  . Ovarian cyst    Past Surgical History  Procedure Laterality Date  . Vaginal delivery      X 2   Family History  Problem Relation Age of Onset  . Heart disease Mother   . Diabetes Father    History  Substance Use Topics  . Smoking status: Never Smoker   . Smokeless tobacco: Never Used  . Alcohol Use: No   OB History   Grav Para Term Preterm Abortions TAB SAB Ect Mult Living   2 2 2       2      Review of Systems ROS: Statement: All systems negative except as marked or noted in the HPI; Constitutional: Negative for fever and chills. ; ; Eyes: Negative for eye pain, redness and discharge. ; ; ENMT: Negative for ear pain, hoarseness, nasal congestion, sinus pressure and sore throat. ; ; Cardiovascular: Negative for chest pain, palpitations, diaphoresis, dyspnea and peripheral edema. ; ; Respiratory: Negative for cough, wheezing and stridor. ; ; Gastrointestinal: Negative for nausea, vomiting, diarrhea, abdominal pain, blood in stool, hematemesis, jaundice and rectal bleeding. . ; ; Genitourinary: Negative for dysuria, flank pain and hematuria. ; ; GYN:  +pelvic pain. No vaginal bleeding, no vaginal discharge, no vulvar pain.;;  Musculoskeletal: Negative for back pain and neck pain. Negative for swelling and trauma.; ; Skin: Negative for pruritus, rash, abrasions, blisters, bruising and skin lesion.; ;  Neuro: Negative for headache, lightheadedness and neck stiffness. Negative for weakness, altered level of consciousness , altered mental status, extremity weakness, paresthesias, involuntary movement, seizure and syncope.        Allergies  Review of patient's allergies indicates no known allergies.  Home Medications   Prior to Admission medications   Medication Sig Start Date End Date Taking? Authorizing Provider  cetirizine (ZYRTEC) 10 MG tablet Take 10 mg by mouth daily as needed for allergies.    Historical Provider, MD  HYDROcodone-acetaminophen (NORCO/VICODIN) 5-325 MG per tablet Take 1 tablet by mouth every 4 (four) hours as needed. 02/16/13   Iona HansenJennifer Irene Rasch, NP  ibuprofen (ADVIL,MOTRIN) 600 MG tablet Take 1 tablet (600 mg total) by mouth every 6 (six) hours as needed. 02/16/13   Iona HansenJennifer Irene Rasch, NP  traMADol (ULTRAM) 50 MG tablet Take 1 tablet (50 mg total) by mouth every 6 (six) hours as needed. 02/12/13   Junius FinnerErin O'Malley, PA-C   BP 95/62  Pulse 67  Temp(Src) 98.5 F (36.9 C) (Oral)  Resp 20  Wt 104 lb (47.174 kg)  SpO2 99%  LMP 05/20/2013 Physical Exam 1330: Physical examination:  Nursing notes reviewed; Vital signs and O2 SAT reviewed;  Constitutional: Well developed, Well nourished, Well hydrated, In no acute distress; Head:  Normocephalic, atraumatic; Eyes: EOMI, PERRL, No scleral icterus; ENMT: Mouth and pharynx normal, Mucous membranes moist; Neck: Supple,  Full range of motion, No lymphadenopathy; Cardiovascular: Regular rate and rhythm, No murmur, rub, or gallop; Respiratory: Breath sounds clear & equal bilaterally, No rales, rhonchi, wheezes.  Speaking full sentences with ease, Normal respiratory effort/excursion; Chest: Nontender, Movement normal; Abdomen: Soft, +suprapubic, left<right pelvic areas tender to palp. No rebound or guarding. Nondistended, Normal bowel sounds; Genitourinary: No CVA tenderness. Pelvic exam performed with permission of pt and female ED tech  assist during exam.  External genitalia w/o lesions. Vaginal vault with thin white discharge.  Cervix w/o lesions, not friable, GC/chlam and wet prep obtained and sent to lab.  Bimanual exam w/o CMT, +uterine and bilateral adnexal tenderness.; Extremities: Pulses normal, No tenderness, No edema, No calf edema or asymmetry.; Neuro: AA&Ox3, Major CN grossly intact.  Speech clear. No gross focal motor or sensory deficits in extremities. Climbs on and off stretcher easily by herself. Gait steady.; Skin: Color normal, Warm, Dry.     ED Course  Procedures     EKG Interpretation None      MDM  MDM Reviewed: previous chart, nursing note and vitals Reviewed previous: labs Interpretation: labs and ultrasound    Results for orders placed during the hospital encounter of 05/31/13  WET PREP, GENITAL      Result Value Ref Range   Yeast Wet Prep HPF POC NONE SEEN  NONE SEEN   Trich, Wet Prep NONE SEEN  NONE SEEN   Clue Cells Wet Prep HPF POC NONE SEEN  NONE SEEN   WBC, Wet Prep HPF POC MODERATE (*) NONE SEEN  CBC WITH DIFFERENTIAL      Result Value Ref Range   WBC 9.5  4.0 - 10.5 K/uL   RBC 4.53  3.87 - 5.11 MIL/uL   Hemoglobin 13.6  12.0 - 15.0 g/dL   HCT 16.140.2  09.636.0 - 04.546.0 %   MCV 88.7  78.0 - 100.0 fL   MCH 30.0  26.0 - 34.0 pg   MCHC 33.8  30.0 - 36.0 g/dL   RDW 40.912.3  81.111.5 - 91.415.5 %   Platelets 196  150 - 400 K/uL   Neutrophils Relative % 70  43 - 77 %   Neutro Abs 6.7  1.7 - 7.7 K/uL   Lymphocytes Relative 22  12 - 46 %   Lymphs Abs 2.1  0.7 - 4.0 K/uL   Monocytes Relative 6  3 - 12 %   Monocytes Absolute 0.5  0.1 - 1.0 K/uL   Eosinophils Relative 2  0 - 5 %   Eosinophils Absolute 0.2  0.0 - 0.7 K/uL   Basophils Relative 0  0 - 1 %   Basophils Absolute 0.0  0.0 - 0.1 K/uL  URINALYSIS, ROUTINE W REFLEX MICROSCOPIC      Result Value Ref Range   Color, Urine YELLOW  YELLOW   APPearance CLEAR  CLEAR   Specific Gravity, Urine 1.025  1.005 - 1.030   pH 7.5  5.0 - 8.0   Glucose,  UA NEGATIVE  NEGATIVE mg/dL   Hgb urine dipstick TRACE (*) NEGATIVE   Bilirubin Urine NEGATIVE  NEGATIVE   Ketones, ur NEGATIVE  NEGATIVE mg/dL   Protein, ur NEGATIVE  NEGATIVE mg/dL   Urobilinogen, UA 1.0  0.0 - 1.0 mg/dL   Nitrite NEGATIVE  NEGATIVE   Leukocytes, UA NEGATIVE  NEGATIVE  URINE MICROSCOPIC-ADD ON      Result Value Ref Range   Squamous Epithelial / LPF RARE  RARE   RBC / HPF 3-6  <3  RBC/hpf   Bacteria, UA RARE  RARE  I-STAT CHEM 8, ED      Result Value Ref Range   Sodium 140  137 - 147 mEq/L   Potassium 3.9  3.7 - 5.3 mEq/L   Chloride 102  96 - 112 mEq/L   BUN 16  6 - 23 mg/dL   Creatinine, Ser 9.14  0.50 - 1.10 mg/dL   Glucose, Bld 86  70 - 99 mg/dL   Calcium, Ion 7.82  1.12 - 1.23 mmol/L   TCO2 27  0 - 100 mmol/L   Hemoglobin 13.9  12.0 - 15.0 g/dL   HCT 95.6  21.3 - 08.6 %  POC URINE PREG, ED      Result Value Ref Range   Preg Test, Ur NEGATIVE  NEGATIVE  POC URINE PREG, ED      Result Value Ref Range   Preg Test, Ur NEGATIVE  NEGATIVE   US Pelvis Complete 05/31/2013   CLINICAL DATA:  Pelvic pain, torsion  EXAM: TRANSABDOMINAL AND TRANSVAGINAL ULTRASOUND OF PELVIS  DOPPLER ULTRASOUND OF OVARIES  TECHNIQUE: Both transabdominal and transvaginal ultrasound examinations of the pelvis were performed. Transabdominal technique was performed for global imaging of the pelvis including uterus, ovaries, adnexal regions, and pelvic cul-de-sac.  It was necessary to proceed with endovaginal exam following the transabdominal exam to visualize the bilateral ovaries. Color and duplex Doppler ultrasound was utilized to evaluate blood flow to the ovaries.  COMPARISON:  None.  FINDINGS: Uterus  Measurements: 6.3 x 3.8 x 4.7 cm. Retroflexed. No fibroids or other mass visualized.  Endometrium  Thickness: 10 mm.  No focal abnormality visualized.  Right ovary  Measurements: 2.5 x 2.3 x 2.2 cm. Dominant 1.5 cm corpus luteal cyst.  Left ovary  Measurements: 2.2 x 1.6 x 1.6 cm. Normal  appearance/no adnexal mass.  Pulsed Doppler evaluation of both ovaries demonstrates normal low-resistance arterial and venous waveforms.  Other findings  No free fluid.  IMPRESSION: 1.5 cm right ovarian corpus luteal cyst, physiologic.  Otherwise negative pelvic ultrasound.  No evidence of ovarian torsion.   Electronically Signed   By: Charline Bills M.D.   On: 05/31/2013 14:51    1700:  Mod amount WBC's seen on wet prep with pelvic TTP: will tx empirically with rocephin/zithromax while GC/chlam pending. Right ovarian cyst on [place]. Pt c/o continued RLQ pain despite meds. Will check CT A/P (pending). Dispo based on results. Sign out to Dr. Silverio Lay.     Laray Anger, DO 05/31/13 [phone]

## 2013-06-01 LAB — GC/CHLAMYDIA PROBE AMP
CT PROBE, AMP APTIMA: NEGATIVE
GC PROBE AMP APTIMA: NEGATIVE

## 2013-12-13 ENCOUNTER — Encounter (HOSPITAL_COMMUNITY): Payer: Self-pay | Admitting: Emergency Medicine

## 2016-09-14 ENCOUNTER — Inpatient Hospital Stay (HOSPITAL_COMMUNITY): Payer: BLUE CROSS/BLUE SHIELD

## 2016-09-14 ENCOUNTER — Inpatient Hospital Stay (HOSPITAL_COMMUNITY)
Admission: AD | Admit: 2016-09-14 | Discharge: 2016-09-14 | Disposition: A | Discharge status: Home / Self Care | Payer: BLUE CROSS/BLUE SHIELD | Source: Ambulatory Visit | Attending: Obstetrics and Gynecology | Admitting: Obstetrics and Gynecology

## 2016-09-14 ENCOUNTER — Encounter (HOSPITAL_COMMUNITY): Payer: Self-pay

## 2016-09-14 DIAGNOSIS — O468X1 Other antepartum hemorrhage, first trimester: Secondary | ICD-10-CM | POA: Diagnosis not present

## 2016-09-14 DIAGNOSIS — O418X10 Other specified disorders of amniotic fluid and membranes, first trimester, not applicable or unspecified: Secondary | ICD-10-CM

## 2016-09-14 DIAGNOSIS — R109 Unspecified abdominal pain: Secondary | ICD-10-CM | POA: Insufficient documentation

## 2016-09-14 DIAGNOSIS — O26899 Other specified pregnancy related conditions, unspecified trimester: Secondary | ICD-10-CM

## 2016-09-14 DIAGNOSIS — Z3A09 9 weeks gestation of pregnancy: Secondary | ICD-10-CM | POA: Insufficient documentation

## 2016-09-14 DIAGNOSIS — O4690 Antepartum hemorrhage, unspecified, unspecified trimester: Secondary | ICD-10-CM

## 2016-09-14 DIAGNOSIS — O3680X0 Pregnancy with inconclusive fetal viability, not applicable or unspecified: Secondary | ICD-10-CM

## 2016-09-14 DIAGNOSIS — O4691 Antepartum hemorrhage, unspecified, first trimester: Secondary | ICD-10-CM | POA: Diagnosis present

## 2016-09-14 LAB — URINALYSIS, ROUTINE W REFLEX MICROSCOPIC
BILIRUBIN URINE: NEGATIVE
Glucose, UA: NEGATIVE mg/dL
Hgb urine dipstick: NEGATIVE
Ketones, ur: NEGATIVE mg/dL
Leukocytes, UA: NEGATIVE
NITRITE: NEGATIVE
Protein, ur: NEGATIVE mg/dL
SPECIFIC GRAVITY, URINE: 1.003 — AB (ref 1.005–1.030)
pH: 7 (ref 5.0–8.0)

## 2016-09-14 LAB — CBC
HEMATOCRIT: 38.5 % (ref 36.0–46.0)
HEMOGLOBIN: 13.2 g/dL (ref 12.0–15.0)
MCH: 29.8 pg (ref 26.0–34.0)
MCHC: 34.3 g/dL (ref 30.0–36.0)
MCV: 86.9 fL (ref 78.0–100.0)
Platelets: 233 10*3/uL (ref 150–400)
RBC: 4.43 MIL/uL (ref 3.87–5.11)
RDW: 12.6 % (ref 11.5–15.5)
WBC: 8.6 10*3/uL (ref 4.0–10.5)

## 2016-09-14 LAB — ABO/RH: ABO/RH(D): AB POS

## 2016-09-14 LAB — WET PREP, GENITAL
Clue Cells Wet Prep HPF POC: NONE SEEN
Sperm: NONE SEEN
TRICH WET PREP: NONE SEEN
YEAST WET PREP: NONE SEEN

## 2016-09-14 LAB — HCG, QUANTITATIVE, PREGNANCY: HCG, BETA CHAIN, QUANT, S: 16937 m[IU]/mL — AB (ref <5)

## 2016-09-14 LAB — POCT PREGNANCY, URINE: Preg Test, Ur: POSITIVE — AB

## 2016-09-14 NOTE — MAU Provider Note (Signed)
History    CSN: [id number]  Arrival date and time: 09/14/16 1215   First Provider Initiated Contact with Patient 09/14/16 1245    Chief Complaint  Patient presents with  . Vaginal Bleeding  . Abdominal Pain   HPI Jamie Ponce is a 31 y.o. G3P2002 at 6344w6d who presents with vaginal bleeding and lower abdominal cramping since 8/2. She states the cramping is all across the bottom of her abdomen and she rates it a 5/10, has not tried anything for the pain. She states the bleeding is scant, only when she wipes. LMP 07/07/16. [place] and they did not tell her the results.   OB History    Gravida Para Term Preterm AB Living   3 2 2     2    SAB TAB Ectopic Multiple Live Births                  Past Medical History:  Diagnosis Date  . Ovarian cyst     Past Surgical History:  Procedure Laterality Date  . NO PAST SURGERIES    . VAGINAL DELIVERY     X 2    Family History  Problem Relation Age of Onset  . Heart disease Mother   . Diabetes Father     Social History  Substance Use Topics  . Smoking status: Never Smoker  . Smokeless tobacco: Never Used  . Alcohol use No    Allergies:  Allergies  Allergen Reactions  . Shrimp [Shellfish Allergy] Hives    Prescriptions Prior to Admission  Medication Sig Dispense Refill Last Dose  . cetirizine (ZYRTEC) 10 MG tablet Take 10 mg by mouth daily as needed for allergies.   05/31/2013 at Unknown time  . HYDROcodone-acetaminophen (NORCO/VICODIN) 5-325 MG per tablet Take 0.5 tablets by mouth every 6 (six) hours as needed for moderate pain.   05/30/2013 at Unknown time  . HYDROcodone-acetaminophen (NORCO/VICODIN) 5-325 MG per tablet Take 1-2 tablets by mouth every 6 (six) hours as needed. 12 tablet 0     Review of Systems  Constitutional: Negative.  Negative for chills and fever.  HENT: Negative.   Respiratory: Negative.  Negative for shortness of breath.   Cardiovascular: Negative.  Negative for  chest pain.  Gastrointestinal: Positive for abdominal pain. Negative for constipation, diarrhea, nausea and vomiting.  Genitourinary: Positive for vaginal bleeding. Negative for dysuria and vaginal discharge.  Neurological: Negative.  Negative for dizziness and headaches.  Psychiatric/Behavioral: Negative.    Physical Exam   Blood pressure 102/61, pulse 75, temperature 98 F (36.7 C), temperature source Oral, resp. rate 18, height 4\' 11"  (1.499 m), weight 124 lb (56.2 kg), last menstrual period 07/07/2016.  Physical Exam  Nursing note and vitals reviewed. Constitutional: She is oriented to person, place, and time. She appears well-developed and well-nourished.  HENT:  Head: Normocephalic and atraumatic.  Eyes: Conjunctivae are normal. No scleral icterus.  Cardiovascular: Normal rate, regular rhythm and normal heart sounds.   Respiratory: Effort normal and breath sounds normal. No respiratory distress.  GI: Soft. She exhibits no distension. There is no tenderness. There is no guarding.  Genitourinary: Cervix exhibits no motion tenderness and no friability. No bleeding in the vagina.  Neurological: She is alert and oriented to person, place, and time.  Skin: Skin is warm and dry.  Psychiatric: She has a normal mood and affect. Her behavior is normal. Judgment and thought content normal.   Pelvic exam: Cervix pink,  visually closed, without lesion, scant white creamy discharge, vaginal walls and external genitalia normal Bimanual exam: Cervix 0/long/high, firm, anterior, neg CMT, uterus nontender, adnexa without tenderness, enlargement, or mass  MAU Course  Procedures Results for orders placed or performed during the hospital encounter of 09/14/16 (from the past 24 hour(s))  Urinalysis, Routine w reflex microscopic     Status: Abnormal   Collection Time: 09/14/16 12:21 PM  Result Value Ref Range   Color, Urine STRAW (A) YELLOW   APPearance CLEAR CLEAR   Specific Gravity, Urine 1.003  (L) 1.005 - 1.030   pH 7.0 5.0 - 8.0   Glucose, UA NEGATIVE NEGATIVE mg/dL   Hgb urine dipstick NEGATIVE NEGATIVE   Bilirubin Urine NEGATIVE NEGATIVE   Ketones, ur NEGATIVE NEGATIVE mg/dL   Protein, ur NEGATIVE NEGATIVE mg/dL   Nitrite NEGATIVE NEGATIVE   Leukocytes, UA NEGATIVE NEGATIVE  Pregnancy, urine POC     Status: Abnormal   Collection Time: 09/14/16 12:32 PM  Result Value Ref Range   Preg Test, Ur POSITIVE (A) NEGATIVE  Wet prep, genital     Status: Abnormal   Collection Time: 09/14/16 12:51 PM  Result Value Ref Range   Yeast Wet Prep HPF POC NONE SEEN NONE SEEN   Trich, Wet Prep NONE SEEN NONE SEEN   Clue Cells Wet Prep HPF POC NONE SEEN NONE SEEN   WBC, Wet Prep HPF POC MODERATE (A) NONE SEEN   Sperm NONE SEEN   CBC     Status: None   Collection Time: 09/14/16  1:01 PM  Result Value Ref Range   WBC 8.6 4.0 - 10.5 K/uL   RBC 4.43 3.87 - 5.11 MIL/uL   Hemoglobin 13.2 12.0 - 15.0 g/dL   HCT 16.1 09.6 - 04.5 %   MCV 86.9 78.0 - 100.0 fL   MCH 29.8 26.0 - 34.0 pg   MCHC 34.3 30.0 - 36.0 g/dL   RDW 40.9 81.1 - 91.4 %   Platelets 233 150 - 400 K/uL  hCG, quantitative, pregnancy     Status: Abnormal   Collection Time: 09/14/16  1:01 PM  Result Value Ref Range   hCG, Beta Chain, Quant, S 16,937 (H) <5 mIU/mL  ABO/Rh     Status: None (Preliminary result)   Collection Time: 09/14/16  1:01 PM  Result Value Ref Range   ABO/RH(D) AB POS    US Ob Comp Less 14 Wks  Result Date: 09/14/2016 CLINICAL DATA:  Abdominal pain and vaginal bleeding for 2 days. Gestational age by LMP of 9 weeks 6 days. EXAM: OBSTETRIC <14 WK [place] AND TRANSVAGINAL OB US TECHNIQUE: Both transabdominal and transvaginal ultrasound examinations were performed for complete evaluation of the gestation as well as the maternal uterus, adnexal regions, and pelvic cul-de-sac. Transvaginal technique was performed to assess early pregnancy. COMPARISON:  None. FINDINGS: Intrauterine gestational sac: Single Yolk sac:   Enlarged yolk sac versus amnion Embryo:  Not Visualized. MSD: 10  mm   5 w   5  d Subchorionic hemorrhage:  Moderate subchorionic hemorrhage. Maternal uterus/adnexae: Retroverted uterus. Normal appearance of both ovaries. No adnexal mass or free fluid identified. IMPRESSION: Single intrauterine gestational sac, with features suspicious but not yet definitive for failed pregnancy. Recommend follow-up US in 10-14 days for definitive diagnosis. This recommendation follows SRU consensus guidelines: Diagnostic Criteria for Nonviable Pregnancy Early in the First Trimester. Malva Limes Med 2013; 782:9562-13. Electronically Signed   By: Myles Rosenthal M.D.   On: 09/14/2016 14:07  [place] Ob Transvaginal  Result Date: 09/14/2016 CLINICAL DATA:  Abdominal pain and vaginal bleeding for 2 days. Gestational age by LMP of 9 weeks 6 days. EXAM: OBSTETRIC <14 WK US AND TRANSVAGINAL OB US TECHNIQUE: Both transabdominal and transvaginal ultrasound examinations were performed for complete evaluation of the gestation as well as the maternal uterus, adnexal regions, and pelvic cul-de-sac. Transvaginal technique was performed to assess early pregnancy. COMPARISON:  None. FINDINGS: Intrauterine gestational sac: Single Yolk sac:  Enlarged yolk sac versus amnion Embryo:  Not Visualized. MSD: 10  mm   5 w   5  d Subchorionic hemorrhage:  Moderate subchorionic hemorrhage. Maternal uterus/adnexae: Retroverted uterus. Normal appearance of both ovaries. No adnexal mass or free fluid identified. IMPRESSION: Single intrauterine gestational sac, with features suspicious but not yet definitive for failed pregnancy. Recommend follow-up US in 10-14 days for definitive diagnosis. This recommendation follows SRU consensus guidelines: Diagnostic Criteria for Nonviable Pregnancy Early in the First Trimester. Malva Limes Engl J Med 2013; 960:4540-98; 369:1443-51. Electronically Signed   By: Myles RosenthalJohn  Stahl M.D.   On: 09/14/2016 14:07   MDM UA, UPT CBC, quant, ABO/Rh- AB Pos Wet prep  and gc/chlamydia US OB Transvaginal US OB Comp Less 14 wks- intrauterine gestational sac with possible yolk sac, cannot rule out failed pregnancy or ectopic, moderate subchorionic hemorrhage   Assessment and Plan   1. Pregnancy of unknown anatomic location   2. Vaginal bleeding in pregnancy   3. Abdominal pain affecting pregnancy   4. Subchorionic hemorrhage of placenta in first trimester, single or unspecified fetus    -Discharge patient home in stable condition -Repeat ultrasound ordered in 1 week -Strict ectopic precautions reviewed -Encouraged to return here or to other Urgent Care/ED if she develops worsening of symptoms, increase in pain, fever, or other concerning symptoms.   Cleone SlimCaroline Neill SNM 09/14/2016, 2:16 PM   .I confirm that I have verified the information documented in the SNM's note and that I have also personally reperformed the physical exam and all medical decision making activities.  Discussed we cannot know for sure what is going on Could be miscarriage or ectopic.  Might be viable WIll need followup to be sure Agree with plan of care  Aviva SignsWilliams, Marie L, CNM

## 2016-09-14 NOTE — Discharge Instructions (Signed)
Abdominal Pain During Pregnancy °Abdominal pain is common in pregnancy. Most of the time, it does not cause harm. There are many causes of abdominal pain. Some causes are more serious than others and sometimes the cause is not known. Abdominal pain can be a sign that something is very wrong with the pregnancy or the pain may have nothing to do with the pregnancy. Always tell your health care provider if you have any abdominal pain. °Follow these instructions at home: °· Do not have sex or put anything in your vagina until your symptoms go away completely. °· Watch your abdominal pain for any changes. °· Get plenty of rest until your pain improves. °· Drink enough fluid to keep your urine clear or pale yellow. °· Take over-the-counter or prescription medicines only as told by your health care provider. °· Keep all follow-up visits as told by your health care provider. This is important. °Contact a health care provider if: °· You have a fever. °· Your pain gets worse or you have cramping. °· Your pain continues after resting. °Get help right away if: °· You are bleeding, leaking fluid, or passing tissue from the vagina. °· You have vomiting or diarrhea that does not go away. °· You have painful or bloody urination. °· You notice a decrease in your baby's movements. °· You feel very weak or faint. °· You have shortness of breath. °· You develop a severe headache with abdominal pain. °· You have abnormal vaginal discharge with abdominal pain. °This information is not intended to replace advice given to you by your health care provider. Make sure you discuss any questions you have with your health care provider. °Document Released: 01/28/2005 Document Revised: 11/09/2015 Document Reviewed: 08/27/2012 °Elsevier Interactive Patient Education © 2018 [employer] ° °First Trimester of Pregnancy °The first trimester of pregnancy is from week 1 until the end of week 13 (months 1 through 3). A week after a sperm fertilizes an  egg, the egg will implant on the wall of the uterus. This embryo will begin to develop into a baby. Genes from you and your partner will form the baby. The female genes will determine whether the baby will be a boy or a girl. At 6-8 weeks, the eyes and face will be formed, and the heartbeat can be seen on ultrasound. At the end of 12 weeks, all the baby's organs will be formed. °Now that you are pregnant, you will want to do everything you can to have a healthy baby. Two of the most important things are to get good prenatal care and to follow your health care provider's instructions. Prenatal care is all the medical care you receive before the baby's birth. This care will help prevent, find, and treat any problems during the pregnancy and childbirth. °Body changes during your first trimester °Your body goes through many changes during pregnancy. The changes vary from woman to woman. °· You may gain or lose a couple of pounds at first. °· You may feel sick to your stomach (nauseous) and you may throw up (vomit). If the vomiting is uncontrollable, call your health care provider. °· You may tire easily. °· You may develop headaches that can be relieved by medicines. All medicines should be approved by your health care provider. °· You may urinate more often. Painful urination may mean you have a bladder infection. °· You may develop heartburn as a result of your pregnancy. °· You may develop constipation because certain hormones are causing the muscles   that push stool through your intestines to slow down. °· You may develop hemorrhoids or swollen veins (varicose veins). °· Your breasts may begin to grow larger and become tender. Your nipples may stick out more, and the tissue that surrounds them (areola) may become darker. °· Your gums may bleed and may be sensitive to brushing and flossing. °· Dark spots or blotches (chloasma, mask of pregnancy) may develop on your face. This will likely fade after the baby is  born. °· Your menstrual periods will stop. °· You may have a loss of appetite. °· You may develop cravings for certain kinds of food. °· You may have changes in your emotions from day to day, such as being excited to be pregnant or being concerned that something may go wrong with the pregnancy and baby. °· You may have more vivid and strange dreams. °· You may have changes in your hair. These can include thickening of your hair, rapid growth, and changes in texture. Some women also have hair loss during or after pregnancy, or hair that feels dry or thin. Your hair will most likely return to normal after your baby is born. ° °What to expect at prenatal visits °During a routine prenatal visit: °· You will be weighed to make sure you and the baby are growing normally. °· Your blood pressure will be taken. °· Your abdomen will be measured to track your baby's growth. °· The fetal heartbeat will be listened to between weeks 10 and 14 of your pregnancy. °· Test results from any previous visits will be discussed. ° °Your health care provider may ask you: °· How you are feeling. °· If you are feeling the baby move. °· If you have had any abnormal symptoms, such as leaking fluid, bleeding, severe headaches, or abdominal cramping. °· If you are using any tobacco products, including cigarettes, chewing tobacco, and electronic cigarettes. °· If you have any questions. ° °Other tests that may be performed during your first trimester include: °· Blood tests to find your blood type and to check for the presence of any previous infections. The tests will also be used to check for low iron levels (anemia) and protein on red blood cells (Rh antibodies). Depending on your risk factors, or if you previously had diabetes during pregnancy, you may have tests to check for high blood sugar that affects pregnant women (gestational diabetes). °· Urine tests to check for infections, diabetes, or protein in the urine. °· An ultrasound to  confirm the proper growth and development of the baby. °· Fetal screens for spinal cord problems (spina bifida) and Down syndrome. °· HIV (human immunodeficiency virus) testing. Routine prenatal testing includes screening for HIV, unless you choose not to have this test. °· You may need other tests to make sure you and the baby are doing well. ° °Follow these instructions at home: °Medicines °· Follow your health care provider's instructions regarding medicine use. Specific medicines may be either safe or unsafe to take during pregnancy. °· Take a prenatal vitamin that contains at least 600 micrograms (mcg) of folic acid. °· If you develop constipation, try taking a stool softener if your health care provider approves. °Eating and drinking °· Eat a balanced diet that includes fresh fruits and vegetables, whole grains, good sources of protein such as meat, eggs, or tofu, and low-fat dairy. Your health care provider will help you determine the amount of weight gain that is right for you. °· Avoid raw meat and uncooked cheese.   These carry germs that can cause birth defects in the baby. °· Eating four or five small meals rather than three large meals a day may help relieve nausea and vomiting. If you start to feel nauseous, eating a few soda crackers can be helpful. Drinking liquids between meals, instead of during meals, also seems to help ease nausea and vomiting. °· Limit foods that are high in fat and processed sugars, such as fried and sweet foods. °· To prevent constipation: °? Eat foods that are high in fiber, such as fresh fruits and vegetables, whole grains, and beans. °? Drink enough fluid to keep your urine clear or pale yellow. °Activity °· Exercise only as directed by your health care provider. Most women can continue their usual exercise routine during pregnancy. Try to exercise for 30 minutes at least 5 days a week. Exercising will help you: °? Control your weight. °? Stay in shape. °? Be prepared for  labor and delivery. °· Experiencing pain or cramping in the lower abdomen or lower back is a good sign that you should stop exercising. Check with your health care provider before continuing with normal exercises. °· Try to avoid standing for long periods of time. Move your legs often if you must stand in one place for a long time. °· Avoid heavy lifting. °· Wear low-heeled shoes and practice good posture. °· You may continue to have sex unless your health care provider tells you not to. °Relieving pain and discomfort °· Wear a good support bra to relieve breast tenderness. °· Take warm sitz baths to soothe any pain or discomfort caused by hemorrhoids. Use hemorrhoid cream if your health care provider approves. °· Rest with your legs elevated if you have leg cramps or low back pain. °· If you develop varicose veins in your legs, wear support hose. Elevate your feet for 15 minutes, 3-4 times a day. Limit salt in your diet. °Prenatal care °· Schedule your prenatal visits by the twelfth week of pregnancy. They are usually scheduled monthly at first, then more often in the last 2 months before delivery. °· Write down your questions. Take them to your prenatal visits. °· Keep all your prenatal visits as told by your health care provider. This is important. °Safety °· Wear your seat belt at all times when driving. °· Make a list of emergency phone numbers, including numbers for family, friends, the hospital, and police and fire departments. °General instructions °· Ask your health care provider for a referral to a local prenatal education class. Begin classes no later than the beginning of month 6 of your pregnancy. °· Ask for help if you have counseling or nutritional needs during pregnancy. Your health care provider can offer advice or refer you to specialists for help with various needs. °· Do not use hot tubs, steam rooms, or saunas. °· Do not douche or use tampons or scented sanitary pads. °· Do not cross your legs  for long periods of time. °· Avoid cat litter boxes and soil used by cats. These carry germs that can cause birth defects in the baby and possibly loss of the fetus by miscarriage or stillbirth. °· Avoid all smoking, herbs, alcohol, and medicines not prescribed by your health care provider. Chemicals in these products affect the formation and growth of the baby. °· Do not use any products that contain nicotine or tobacco, such as cigarettes and e-cigarettes. If you need help quitting, ask your health care provider. You may receive counseling support and   other resources to help you quit. °· Schedule a dentist appointment. At home, brush your teeth with a soft toothbrush and be gentle when you floss. °Contact a health care provider if: °· You have dizziness. °· You have mild pelvic cramps, pelvic pressure, or nagging pain in the abdominal area. °· You have persistent nausea, vomiting, or diarrhea. °· You have a bad smelling vaginal discharge. °· You have pain when you urinate. °· You notice increased swelling in your face, hands, legs, or ankles. °· You are exposed to fifth disease or chickenpox. °· You are exposed to German measles (rubella) and have never had it. °Get help right away if: °· You have a fever. °· You are leaking fluid from your vagina. °· You have spotting or bleeding from your vagina. °· You have severe abdominal cramping or pain. °· You have rapid weight gain or loss. °· You vomit blood or material that looks like coffee grounds. °· You develop a severe headache. °· You have shortness of breath. °· You have any kind of trauma, such as from a fall or a car accident. °Summary °· The first trimester of pregnancy is from week 1 until the end of week 13 (months 1 through 3). °· Your body goes through many changes during pregnancy. The changes vary from woman to woman. °· You will have routine prenatal visits. During those visits, your health care provider will examine you, discuss any test results you  may have, and talk with you about how you are feeling. °This information is not intended to replace advice given to you by your health care provider. Make sure you discuss any questions you have with your health care provider. °Document Released: 01/22/2001 Document Revised: 01/10/2016 Document Reviewed: 01/10/2016 °Elsevier Interactive Patient Education © 2017 [employer] ° °

## 2016-09-14 NOTE — MAU Note (Signed)
Pt reports pain with intermittent bleeding since Thursday. [place] and they didn't say anything, did an u/s and told her to rest. The pain and bleeding is still a problem for her. Notices bleeding when she wipes in the bathroom.  +HPT at the end of June.

## 2016-09-16 LAB — GC/CHLAMYDIA PROBE AMP (~~LOC~~) NOT AT ARMC
Chlamydia: NEGATIVE
NEISSERIA GONORRHEA: NEGATIVE

## 2016-09-17 ENCOUNTER — Inpatient Hospital Stay (HOSPITAL_COMMUNITY): Payer: BLUE CROSS/BLUE SHIELD

## 2016-09-17 ENCOUNTER — Inpatient Hospital Stay (HOSPITAL_COMMUNITY)
Admission: AD | Admit: 2016-09-17 | Discharge: 2016-09-17 | Disposition: A | Discharge status: Home / Self Care | Payer: BLUE CROSS/BLUE SHIELD | Source: Ambulatory Visit | Attending: Family Medicine | Admitting: Family Medicine

## 2016-09-17 DIAGNOSIS — O209 Hemorrhage in early pregnancy, unspecified: Secondary | ICD-10-CM

## 2016-09-17 DIAGNOSIS — O021 Missed abortion: Secondary | ICD-10-CM

## 2016-09-17 DIAGNOSIS — O26899 Other specified pregnancy related conditions, unspecified trimester: Secondary | ICD-10-CM

## 2016-09-17 DIAGNOSIS — Z3A10 10 weeks gestation of pregnancy: Secondary | ICD-10-CM | POA: Insufficient documentation

## 2016-09-17 DIAGNOSIS — R109 Unspecified abdominal pain: Secondary | ICD-10-CM

## 2016-09-17 DIAGNOSIS — O208 Other hemorrhage in early pregnancy: Secondary | ICD-10-CM

## 2016-09-17 LAB — HCG, QUANTITATIVE, PREGNANCY: hCG, Beta Chain, Quant, S: 12954 m[IU]/mL — ABNORMAL HIGH (ref <5)

## 2016-09-17 LAB — CBC
HCT: 37.2 % (ref 36.0–46.0)
HEMOGLOBIN: 12.9 g/dL (ref 12.0–15.0)
MCH: 29.9 pg (ref 26.0–34.0)
MCHC: 34.7 g/dL (ref 30.0–36.0)
MCV: 86.1 fL (ref 78.0–100.0)
Platelets: 256 10*3/uL (ref 150–400)
RBC: 4.32 MIL/uL (ref 3.87–5.11)
RDW: 12.4 % (ref 11.5–15.5)
WBC: 8.3 10*3/uL (ref 4.0–10.5)

## 2016-09-17 LAB — URINALYSIS, ROUTINE W REFLEX MICROSCOPIC
Bilirubin Urine: NEGATIVE
GLUCOSE, UA: NEGATIVE mg/dL
Hgb urine dipstick: NEGATIVE
Ketones, ur: NEGATIVE mg/dL
LEUKOCYTES UA: NEGATIVE
Nitrite: NEGATIVE
PH: 7 (ref 5.0–8.0)
Protein, ur: NEGATIVE mg/dL
SPECIFIC GRAVITY, URINE: 1.005 (ref 1.005–1.030)

## 2016-09-17 NOTE — MAU Note (Signed)
Pt. Here due to vaginal bleeding that started on Thursday of last week.  Pt. Has been bleeding on and off since then.  Pt. Also c/o of lower abd. Pain. Pain 6/10. Pt. Has not self medicated.

## 2016-09-17 NOTE — MAU Note (Signed)
End of shift, report given to Alexx, RN.

## 2016-09-17 NOTE — MAU Provider Note (Signed)
History     CSN: [id number]  Arrival date and time: 09/17/16 [id number]   First Provider Initiated Contact with Patient 09/17/16 1937      Chief Complaint  Patient presents with  . Vaginal Bleeding  . Abdominal Pain   HPI   Jamie Ponce is a 31 y.o. female G3P2002 @ [id number] here in MAU with vaginal bleeding. The bleeding started on Saturday and she was seen here in [facility]U for the bleeding. The bleeding today is light-moderate. The bleeding is bright red. She is also having lower abdominal pain that comes and goes. She currently rates her pain 7/10; she has not taken anything for the pain. She states that the bleeding is heavy enough that she has to wear a pad.  She was told that she will need another US in about 1 week.   OB History    Gravida Para Term Preterm AB Living   3 2 2     2    SAB TAB Ectopic Multiple Live Births                  Past Medical History:  Diagnosis Date  . Ovarian cyst     Past Surgical History:  Procedure Laterality Date  . NO PAST SURGERIES    . VAGINAL DELIVERY     X 2    Family History  Problem Relation Age of Onset  . Heart disease Mother   . Diabetes Father     Social History  Substance Use Topics  . Smoking status: Never Smoker  . Smokeless tobacco: Never Used  . Alcohol use No    Allergies:  Allergies  Allergen Reactions  . Shrimp [Shellfish Allergy] Hives    Prescriptions Prior to Admission  Medication Sig Dispense Refill Last Dose  . cetirizine (ZYRTEC) 10 MG tablet Take 10 mg by mouth daily as needed for allergies.   05/31/2013 at Unknown time   Results for orders placed or performed during the hospital encounter of 09/17/16 (from the past 72 hour(s))  Urinalysis, Routine w reflex microscopic     Status: Abnormal   Collection Time: 09/17/16  6:25 PM  Result Value Ref Range   Color, Urine STRAW (A) YELLOW   APPearance CLEAR CLEAR   Specific Gravity, Urine 1.005 1.005 - 1.030   pH 7.0 5.0 - 8.0   Glucose, UA NEGATIVE NEGATIVE  mg/dL   Hgb urine dipstick NEGATIVE NEGATIVE   Bilirubin Urine NEGATIVE NEGATIVE   Ketones, ur NEGATIVE NEGATIVE mg/dL   Protein, ur NEGATIVE NEGATIVE mg/dL   Nitrite NEGATIVE NEGATIVE   Leukocytes, UA NEGATIVE NEGATIVE    Review of Systems  Gastrointestinal: Positive for abdominal pain.  Genitourinary: Positive for vaginal bleeding.   Physical Exam   Last menstrual period 07/07/2016.  Physical Exam  Constitutional: She is oriented to person, place, and time. She appears well-developed and well-nourished. No distress.  HENT:  Head: Normocephalic.  Eyes: Pupils are equal, round, and reactive to light.  GI: Soft. She exhibits no distension. There is no tenderness. There is no rebound.  Genitourinary:  Genitourinary Comments: Bimanual exam: Cervix slightly open, external os thin, anterior. Small amount of dark red blood noted on exam glove.   Musculoskeletal: Normal range of motion.  Neurological: She is alert and oriented to person, place, and time.  Skin: Skin is warm. She is not diaphoretic.  Psychiatric: Her behavior is normal.    MAU Course  Procedures  None  MDM  CBC & Quant. Will compare  to Quant drawn from 4 days ago.  Offered patient medication for pain management: patient declined AB positive blood type.  Report given to Luna Kitchens CNM who resumes care of the patient. Patient awaiting [place] and labs.   Beta hcg is 12,000, down from 16,000 3 days ago.  Discussed patient's US findings and beta HCG findings. Explained to patient that pregnancy is not viable; patient prefer to let pregnancy pass on its own. Patient and husband are eager to leave. Message sent to clinic to schedule patient for a follow-up appt with a provider in the clinic in two weeks.  Assessment and Plan   1. Missed abortion   2. Abdominal pain affecting pregnancy   3. Vaginal bleeding affecting early pregnancy    2. Patient stable for discharge with bleeding precautions.  3. Patient knows  that we will call her for an appt in the next 2-3 weeks for follow up from her miscarriage.  4. All questions answered.  Luna Kitchens CNM

## 2016-09-17 NOTE — Discharge Instructions (Signed)

## 2016-09-23 ENCOUNTER — Inpatient Hospital Stay (HOSPITAL_COMMUNITY)
Admission: AD | Admit: 2016-09-23 | Discharge: 2016-09-24 | Disposition: A | Discharge status: Home / Self Care | Payer: BLUE CROSS/BLUE SHIELD | Source: Ambulatory Visit | Attending: Obstetrics and Gynecology | Admitting: Obstetrics and Gynecology

## 2016-09-23 ENCOUNTER — Inpatient Hospital Stay (HOSPITAL_COMMUNITY): Payer: BLUE CROSS/BLUE SHIELD

## 2016-09-23 ENCOUNTER — Encounter (HOSPITAL_COMMUNITY): Payer: Self-pay

## 2016-09-23 DIAGNOSIS — O468X2 Other antepartum hemorrhage, second trimester: Secondary | ICD-10-CM | POA: Insufficient documentation

## 2016-09-23 DIAGNOSIS — O039 Complete or unspecified spontaneous abortion without complication: Secondary | ICD-10-CM | POA: Diagnosis not present

## 2016-09-23 DIAGNOSIS — O209 Hemorrhage in early pregnancy, unspecified: Secondary | ICD-10-CM

## 2016-09-23 DIAGNOSIS — Z79899 Other long term (current) drug therapy: Secondary | ICD-10-CM | POA: Diagnosis not present

## 2016-09-23 DIAGNOSIS — Z3A22 22 weeks gestation of pregnancy: Secondary | ICD-10-CM | POA: Insufficient documentation

## 2016-09-23 DIAGNOSIS — Z91013 Allergy to seafood: Secondary | ICD-10-CM | POA: Diagnosis not present

## 2016-09-23 DIAGNOSIS — O26892 Other specified pregnancy related conditions, second trimester: Secondary | ICD-10-CM | POA: Diagnosis not present

## 2016-09-23 DIAGNOSIS — O3482 Maternal care for other abnormalities of pelvic organs, second trimester: Secondary | ICD-10-CM | POA: Diagnosis not present

## 2016-09-23 DIAGNOSIS — N898 Other specified noninflammatory disorders of vagina: Secondary | ICD-10-CM | POA: Insufficient documentation

## 2016-09-23 LAB — URINALYSIS, ROUTINE W REFLEX MICROSCOPIC
BILIRUBIN URINE: NEGATIVE
Glucose, UA: NEGATIVE mg/dL
KETONES UR: NEGATIVE mg/dL
LEUKOCYTES UA: NEGATIVE
NITRITE: NEGATIVE
Protein, ur: NEGATIVE mg/dL
SPECIFIC GRAVITY, URINE: 1.015 (ref 1.005–1.030)
Squamous Epithelial / LPF: NONE SEEN
pH: 8 (ref 5.0–8.0)

## 2016-09-23 NOTE — MAU Note (Signed)
Pt here today with more bleeding today and clots; was told had a miscarriage on Tuesday. Having back and abdominal pain

## 2016-09-24 ENCOUNTER — Encounter (HOSPITAL_COMMUNITY): Payer: Self-pay

## 2016-09-24 DIAGNOSIS — O039 Complete or unspecified spontaneous abortion without complication: Secondary | ICD-10-CM | POA: Diagnosis not present

## 2016-09-24 MED ORDER — IBUPROFEN 600 MG PO TABS: 600.0000 mg | ORAL_TABLET | Freq: Four times a day (QID) | ORAL | 1 refills | Status: DC | PRN

## 2016-09-24 MED ORDER — IBUPROFEN 600 MG PO TABS
600.0000 mg | ORAL_TABLET | Freq: Once | ORAL | Status: AC
  Administered 2016-09-24: 600 mg via ORAL
  Filled 2016-09-24: qty 1

## 2016-09-24 NOTE — Discharge Instructions (Signed)

## 2016-09-24 NOTE — MAU Provider Note (Signed)
Chief Complaint: Abdominal Pain and Vaginal Bleeding   First Provider Initiated Contact with Patient 09/23/16 2357        SUBJECTIVE HPI: Jamie Ponce is a 31 y.o. [id number] at [redacted]w[redacted]d by LMP who presents to maternity admissions reporting vaginal bleeding with clots.  Also has pelvic pain and low back pain.   Was told she had an inevitable SAB on 09/17/16.  . She denies vaginal itching/burning, urinary symptoms, h/a, dizziness, n/v, or fever/chills.     Abdominal Pain  This is a recurrent problem. The current episode started today. The onset quality is gradual. The problem occurs intermittently. The problem has been unchanged. The pain is located in the suprapubic region. The quality of the pain is cramping. The abdominal pain does not radiate. Pertinent negatives include no constipation, diarrhea, fever, myalgias, nausea or vomiting. Nothing aggravates the pain. The pain is relieved by nothing. She has tried nothing for the symptoms.  Vaginal Bleeding  The patient's primary symptoms include pelvic pain and vaginal bleeding. The patient's pertinent negatives include no genital itching, genital lesions or genital odor. This is a recurrent problem. The current episode started today. The pain is moderate. The problem affects both sides. She is pregnant. Associated symptoms include abdominal pain. Pertinent negatives include no constipation, diarrhea, fever, nausea or vomiting. The vaginal discharge was bloody. The vaginal bleeding is heavier than menses. She has been passing clots. She has not been passing tissue. Nothing aggravates the symptoms. She has tried nothing for the symptoms.   RN note: Pt here today with more bleeding today and clots; was told had a miscarriage on Tuesday. Having back and abdominal pain  Past Medical History:  Diagnosis Date  . Ovarian cyst    Past Surgical History:  Procedure Laterality Date  . NO PAST SURGERIES    . VAGINAL DELIVERY     X 2   Social History   Social  History  . Marital status: Married    Spouse name: N/A  . Number of children: N/A  . Years of education: N/A   Occupational History  . Not on file.   Social History Main Topics  . Smoking status: Never Smoker  . Smokeless tobacco: Never Used  . Alcohol use No  . Drug use: No  . Sexual activity: Not Currently    Birth control/ protection: None   Other Topics Concern  . Not on file   Social History Narrative  . No narrative on file   No current facility-administered medications on file prior to encounter.    Current Outpatient Prescriptions on File Prior to Encounter  Medication Sig Dispense Refill  . cetirizine (ZYRTEC) 10 MG tablet Take 10 mg by mouth daily as needed for allergies.     Allergies  Allergen Reactions  . Shrimp [Shellfish Allergy] Hives    I have reviewed patient's Past Medical Hx, Surgical Hx, Family Hx, Social Hx, medications and allergies.   ROS:  Review of Systems  Constitutional: Negative for fever.  Gastrointestinal: Positive for abdominal pain. Negative for constipation, diarrhea, nausea and vomiting.  Genitourinary: Positive for pelvic pain and vaginal bleeding.  Musculoskeletal: Negative for myalgias.   Review of Systems  Other systems negative   Physical Exam  Physical Exam Patient Vitals for the past 24 hrs:  BP Temp Temp src Pulse Resp SpO2 Height Weight  09/23/16 2355 109/68 - - 69 19 - - -  09/23/16 2110 101/61 98.4 F (36.9 C) Oral 80 18 99 % 5' (1.524 m)  128 lb (58.1 kg)   Constitutional: Well-developed, well-nourished female in no acute distress.  Cardiovascular: normal rate Respiratory: normal effort GI: Abd soft, non-tender. Pos BS x 4 MS: Extremities nontender, no edema, normal ROM Neurologic: Alert and oriented x 4.  GU: Neg CVAT.  PELVIC EXAM: Cervix pink, visually closed, without lesion, moderate blood, vaginal walls and external genitalia normal Bimanual exam: Cervix 0/long/high, firm, anterior, neg CMT, uterus  nontender, nonenlarged, adnexa without tenderness, enlargement, or mass  Products of conception present at cervical os, removed.    LAB RESULTS Results for orders placed or performed during the hospital encounter of 09/23/16 (from the past 24 hour(s))  Urinalysis, Routine w reflex microscopic     Status: Abnormal   Collection Time: 09/23/16  9:05 PM  Result Value Ref Range   Color, Urine STRAW (A) YELLOW   APPearance HAZY (A) CLEAR   Specific Gravity, Urine 1.015 1.005 - 1.030   pH 8.0 5.0 - 8.0   Glucose, UA NEGATIVE NEGATIVE mg/dL   Hgb urine dipstick LARGE (A) NEGATIVE   Bilirubin Urine NEGATIVE NEGATIVE   Ketones, ur NEGATIVE NEGATIVE mg/dL   Protein, ur NEGATIVE NEGATIVE mg/dL   Nitrite NEGATIVE NEGATIVE   Leukocytes, UA NEGATIVE NEGATIVE   RBC / HPF TOO NUMEROUS TO COUNT 0 - 5 RBC/hpf   WBC, UA 0-5 0 - 5 WBC/hpf   Bacteria, UA RARE (A) NONE SEEN   Squamous Epithelial / LPF NONE SEEN NONE SEEN    --/--/AB POS (08/04 1301)  IMAGING US Ob Comp Less 14 Wks  Result Date: 09/14/2016 CLINICAL DATA:  Abdominal pain and vaginal bleeding for 2 days. Gestational age by LMP of 9 weeks 6 days. EXAM: OBSTETRIC <14 WK [place] AND TRANSVAGINAL OB US TECHNIQUE: Both transabdominal and transvaginal ultrasound examinations were performed for complete evaluation of the gestation as well as the maternal uterus, adnexal regions, and pelvic cul-de-sac. Transvaginal technique was performed to assess early pregnancy. COMPARISON:  None. FINDINGS: Intrauterine gestational sac: Single Yolk sac:  Enlarged yolk sac versus amnion Embryo:  Not Visualized. MSD: 10  mm   5 w   5  d Subchorionic hemorrhage:  Moderate subchorionic hemorrhage. Maternal uterus/adnexae: Retroverted uterus. Normal appearance of both ovaries. No adnexal mass or free fluid identified. IMPRESSION: Single intrauterine gestational sac, with features suspicious but not yet definitive for failed pregnancy. Recommend follow-up US in 10-14 days for  definitive diagnosis. This recommendation follows SRU consensus guidelines: Diagnostic Criteria for Nonviable Pregnancy Early in the First Trimester. Malva Limes Med 2013; 161:0960-45. Electronically Signed   By: Myles Rosenthal M.D.   On: 09/14/2016 14:07   US Ob Transvaginal  Result Date: 09/23/2016 CLINICAL DATA:  Vaginal bleeding, previous suspected failed pregnancy. EXAM: TRANSVAGINAL OB ULTRASOUND TECHNIQUE: Transvaginal ultrasound was performed for complete evaluation of the gestation as well as the maternal uterus, adnexal regions, and pelvic cul-de-sac. COMPARISON:  Obstetrical ultrasound September 17, 2016 FINDINGS: Intrauterine gestational sac: Teardrop anechoic cystic structure in the endometrium, at the level of the cervix measuring 5.8 mm. Yolk sac:  Not present Embryo:  Not present Cardiac Activity: Not applicable Subchorionic hemorrhage:  None visualized. Maternal uterus/adnexae: Normal appearance of the adnexae. 6 mm endometrial stripe. IMPRESSION: New 6 mm irregular cystic structure at the level of the cervix most consistent with passing of gestational sac. Electronically Signed   By: Awilda Metro M.D.   On: 09/23/2016 22:09   US Ob Transvaginal  Result Date: 09/17/2016 CLINICAL DATA:  Bleeding in the first  trimester pregnancy EXAM: TRANSVAGINAL OB ULTRASOUND TECHNIQUE: Transvaginal ultrasound was performed for complete evaluation of the gestation as well as the maternal uterus, adnexal regions, and pelvic cul-de-sac. COMPARISON:  09/14/2016 FINDINGS: Intrauterine gestational sac: Single Yolk sac: Enlarged yolk sac (10 mm in diameter) out of proportion to gestational sac is again seen. Embryo:  Not Visualized. Cardiac Activity: Not Visualized. Heart Rate: Not applicable MSD: 11  mm   5 w   5  d Subchorionic hemorrhage: Ill-defined hypoechogenicity about the gestational sac is suspicious for small subchorionic focus of hemorrhage. Maternal uterus/adnexae: Corpus luteum noted of the right ovary.  Left ovary is unremarkable. IMPRESSION: Enlarged yolk sac out of proportion to mean sac diameter. No progression in size of gestational sac or fetal pole identified. Findings again are suspicious for failed intrauterine pregnancy. Findings are suspicious but not yet definitive for failed pregnancy. Recommend follow-up US in 10-14 days for definitive diagnosis. This recommendation follows SRU consensus guidelines: Diagnostic Criteria for Nonviable Pregnancy Early in the First Trimester. Malva Limes Engl J Med 2013; 161:0960-45; 369:1443-51. Electronically Signed   By: Tollie Ethavid  Kwon M.D.   On: 09/17/2016 21:12   [place] Ob Transvaginal  Result Date: 09/14/2016 CLINICAL DATA:  Abdominal pain and vaginal bleeding for 2 days. Gestational age by LMP of 9 weeks 6 days. EXAM: OBSTETRIC <14 WK US AND TRANSVAGINAL OB US TECHNIQUE: Both transabdominal and transvaginal ultrasound examinations were performed for complete evaluation of the gestation as well as the maternal uterus, adnexal regions, and pelvic cul-de-sac. Transvaginal technique was performed to assess early pregnancy. COMPARISON:  None. FINDINGS: Intrauterine gestational sac: Single Yolk sac:  Enlarged yolk sac versus amnion Embryo:  Not Visualized. MSD: 10  mm   5 w   5  d Subchorionic hemorrhage:  Moderate subchorionic hemorrhage. Maternal uterus/adnexae: Retroverted uterus. Normal appearance of both ovaries. No adnexal mass or free fluid identified. IMPRESSION: Single intrauterine gestational sac, with features suspicious but not yet definitive for failed pregnancy. Recommend follow-up US in 10-14 days for definitive diagnosis. This recommendation follows SRU consensus guidelines: Diagnostic Criteria for Nonviable Pregnancy Early in the First Trimester. Malva Limes Engl J Med 2013; 409:8119-14; 369:1443-51. Electronically Signed   By: Myles RosenthalJohn  Stahl M.D.   On: 09/14/2016 14:07    MAU Management/MDM: Gestational sac seen near cervix on US On exam, gestational sac present at cervical os, removed No  hemorrhage. Ibuprofen given for cramping Has appt in clinic in 2 weeks for followup   ASSESSMENT 1. Vaginal bleeding before [redacted] weeks gestation   2.    Completed spontaneous abortion  PLAN Discharge home Rx ibuprofen for cramping  Pt stable at time of discharge. Encouraged to return here or to other Urgent Care/ED if she develops worsening of symptoms, increase in pain, fever, or other concerning symptoms.    Jamie BourgeoisMarie Ivionna Ponce CNM, MSN Certified Nurse-Midwife 09/24/2016  12:05 AM

## 2016-10-09 ENCOUNTER — Encounter: Payer: Self-pay | Admitting: Advanced Practice Midwife

## 2016-10-09 ENCOUNTER — Ambulatory Visit (INDEPENDENT_AMBULATORY_CARE_PROVIDER_SITE_OTHER): Payer: BLUE CROSS/BLUE SHIELD | Admitting: Advanced Practice Midwife

## 2016-10-09 VITALS — BP 109/65 | HR 75 | Ht 60.0 in | Wt 125.7 lb

## 2016-10-09 DIAGNOSIS — O039 Complete or unspecified spontaneous abortion without complication: Secondary | ICD-10-CM | POA: Diagnosis not present

## 2016-10-09 NOTE — Progress Notes (Signed)
Patient here today for a New GYN visit. Patient had a miscarriage 2wks ago. Patient complains of dull pain in lower part of abdomen that started yesterday, pain is at a level 3.

## 2016-10-09 NOTE — Progress Notes (Signed)
Subjective:     Patient ID: Jamie Ponce, female   DOB: 28-Feb-1985, 31 y.o.   MRN: [id number]  HPI 31 y.o. G3P2002 presents to the office 2 weeks after documented SAB.  She was diagnosed with IUP in MAU, then had drop in hcg from 16,000 to 12,000 and US showing absence of POCs.  She reports bleeding stopped 3 days ago but she has some mild lower abdominal cramping which has not required treatment.  She is interested in becoming pregnant again as soon as possible.    Review of Systems  Constitutional: Negative for chills, fatigue and fever.  Respiratory: Negative for shortness of breath.   Cardiovascular: Negative for chest pain.  Gastrointestinal: Positive for abdominal pain.  Genitourinary: Positive for pelvic pain. Negative for difficulty urinating, dysuria, flank pain, vaginal bleeding, vaginal discharge and vaginal pain.  Neurological: Negative for dizziness and headaches.  Psychiatric/Behavioral: Negative.        Objective:   Physical Exam  BP 109/65   Pulse 75   Ht 5' (1.524 m)   Wt 125 lb 11.2 oz (57 kg)   LMP 07/08/2016   BMI 24.55 kg/m  VS reviewed, nursing note reviewed,  Constitutional: well developed, well nourished, no distress HEENT: normocephalic CV: normal rate Pulm/chest wall: normal effort Abdomen: soft Neuro: alert and oriented x 3 Skin: warm, dry Psych: affect normal    Assessment:     1. Miscarriage  - Beta HCG, Quant --Follow quant hcg weekly until <5    Plan:     Will call pt with results to schedule follow up appointments

## 2016-10-10 LAB — BETA HCG QUANT (REF LAB): hCG Quant: 9 m[IU]/mL

## 2016-10-11 ENCOUNTER — Telehealth: Payer: Self-pay | Admitting: Lab

## 2016-10-11 NOTE — Telephone Encounter (Signed)
Called patient to give results of Quant levels which is 9. Also told patient she need to come in to the office, next week to get her blood drawn again. Patient stated she understood and she will be in next week.

## 2016-10-11 NOTE — Telephone Encounter (Signed)
-----   Message from Hurshel PartyLisa A Leftwich-Kirby, CNM sent at 10/10/2016  8:42 PM EDT ----- Regarding: Follow up visit needed I saw this pt 8/29 for follow up after miscarriage. Her quant hcg dropped significantly from 12,000 2 weeks ago to 9 but she needs one more visit in 1 week for hcg only.  Please call to let her know and schedule visit. Thank you.

## 2016-10-17 ENCOUNTER — Other Ambulatory Visit: Payer: BLUE CROSS/BLUE SHIELD

## 2016-10-17 DIAGNOSIS — O039 Complete or unspecified spontaneous abortion without complication: Secondary | ICD-10-CM

## 2016-10-18 ENCOUNTER — Telehealth: Payer: Self-pay | Admitting: Lab

## 2016-10-18 ENCOUNTER — Encounter: Payer: Self-pay | Admitting: Obstetrics and Gynecology

## 2016-10-18 LAB — BETA HCG QUANT (REF LAB): HCG QUANT: 3 m[IU]/mL

## 2016-10-18 NOTE — Telephone Encounter (Signed)
-----   Message from Blodgett Bingharlie Pickens, MD sent at 10/18/2016  8:06 AM EDT ----- Can you let her know that she has a completed miscarriage and should expect a period in a month or so? thanks

## 2016-10-18 NOTE — Telephone Encounter (Signed)
Called patient with results. Told patient her miscarriage was complete and she should expect her menstrual cycle in a month or so. Patient stated she understood and she has no questions.

## 2016-10-22 ENCOUNTER — Encounter: Payer: Self-pay | Admitting: General Practice

## 2017-02-19 ENCOUNTER — Ambulatory Visit: Payer: Self-pay | Admitting: Physician Assistant

## 2017-08-17 ENCOUNTER — Emergency Department (HOSPITAL_COMMUNITY): Payer: BLUE CROSS/BLUE SHIELD

## 2017-08-17 ENCOUNTER — Emergency Department (HOSPITAL_COMMUNITY)
Admission: EM | Admit: 2017-08-17 | Discharge: 2017-08-18 | Disposition: A | Discharge status: Home / Self Care | Payer: BLUE CROSS/BLUE SHIELD | Attending: Emergency Medicine | Admitting: Emergency Medicine

## 2017-08-17 ENCOUNTER — Encounter (HOSPITAL_COMMUNITY): Payer: Self-pay | Admitting: Emergency Medicine

## 2017-08-17 DIAGNOSIS — R1030 Lower abdominal pain, unspecified: Secondary | ICD-10-CM

## 2017-08-17 DIAGNOSIS — N83201 Unspecified ovarian cyst, right side: Secondary | ICD-10-CM | POA: Diagnosis not present

## 2017-08-17 LAB — COMPREHENSIVE METABOLIC PANEL
ALBUMIN: 4.1 g/dL (ref 3.5–5.0)
ALT: 13 U/L (ref 0–44)
AST: 18 U/L (ref 15–41)
Alkaline Phosphatase: 38 U/L (ref 38–126)
Anion gap: 10 (ref 5–15)
BUN: 10 mg/dL (ref 6–20)
CHLORIDE: 101 mmol/L (ref 98–111)
CO2: 23 mmol/L (ref 22–32)
Calcium: 9.5 mg/dL (ref 8.9–10.3)
Creatinine, Ser: 0.58 mg/dL (ref 0.44–1.00)
GFR calc Af Amer: >60 mL/min (ref >60)
GFR calc non Af Amer: >60 mL/min (ref >60)
GLUCOSE: 91 mg/dL (ref 70–99)
POTASSIUM: 3.1 mmol/L — AB (ref 3.5–5.1)
Sodium: 134 mmol/L — ABNORMAL LOW (ref 135–145)
Total Bilirubin: 0.8 mg/dL (ref 0.3–1.2)
Total Protein: 7.7 g/dL (ref 6.5–8.1)

## 2017-08-17 LAB — URINALYSIS, ROUTINE W REFLEX MICROSCOPIC
BACTERIA UA: NONE SEEN
Bilirubin Urine: NEGATIVE
Glucose, UA: NEGATIVE mg/dL
Ketones, ur: NEGATIVE mg/dL
Leukocytes, UA: NEGATIVE
Nitrite: NEGATIVE
PH: 7 (ref 5.0–8.0)
Protein, ur: NEGATIVE mg/dL
SPECIFIC GRAVITY, URINE: 1.002 — AB (ref 1.005–1.030)

## 2017-08-17 LAB — CBC
HEMATOCRIT: 40.4 % (ref 36.0–46.0)
Hemoglobin: 13 g/dL (ref 12.0–15.0)
MCH: 28.8 pg (ref 26.0–34.0)
MCHC: 32.2 g/dL (ref 30.0–36.0)
MCV: 89.6 fL (ref 78.0–100.0)
Platelets: 274 10*3/uL (ref 150–400)
RBC: 4.51 MIL/uL (ref 3.87–5.11)
RDW: 12.1 % (ref 11.5–15.5)
WBC: 8.1 10*3/uL (ref 4.0–10.5)

## 2017-08-17 LAB — I-STAT BETA HCG BLOOD, ED (MC, WL, AP ONLY): I-stat hCG, quantitative: <5 m[IU]/mL (ref <5)

## 2017-08-17 LAB — WET PREP, GENITAL
CLUE CELLS WET PREP: NONE SEEN
Sperm: NONE SEEN
Trich, Wet Prep: NONE SEEN
WBC, Wet Prep HPF POC: NONE SEEN
Yeast Wet Prep HPF POC: NONE SEEN

## 2017-08-17 LAB — LIPASE, BLOOD: LIPASE: 28 U/L (ref 11–51)

## 2017-08-17 NOTE — ED Triage Notes (Addendum)
Reports lower abdominal cramping that comes and goes for two weeks.  Hx of the same a few years ago.  Diagnosed with ovarian cyst.  Not on birth control.  Also reports white discharge.  Denies any itching or vaginal pain.

## 2017-08-17 NOTE — ED Provider Notes (Signed)
[facility] EMERGENCY DEPARTMENT Provider Note   CSN: [id number] Arrival date & time: 08/17/17  4098     History   Chief Complaint Chief Complaint  Patient presents with  . Abdominal Pain    HPI Jamie Ponce is a 32 y.o. female with PMH/o Ovarian cyst who presents for evaluation of lower abdominal cramping that is been intermittently ongoing for last 2 weeks.  Patient reports that she will have an intermittent " sharp, sometimes dull cramping pain to the lower abdomen."  She states that there is no triggering event.  She states that this pain usually resolves on its own.  Patient states that she has had some mucus-like vaginal discharge.  She states that she had similar pain a few years ago and states that she was diagnosed with an ovarian cyst.  She does report that her pain was more to the side last time and today is more in the suprapubic region.  Patient states she has been able to eat and drink without any difficulty.  She has had no nausea/vomiting.  Patient reports she has taken hydrocodone which she had from her previous prescription with improvement in pain.  Patient reports she is currently sexually active with one partner.  They do not use protection.  Patient denies any fever, chest pain, difficulty breathing, dysuria, hematuria, vaginal bleeding.  The history is provided by the patient.    Past Medical History:  Diagnosis Date  . Ovarian cyst     There are no active problems to display for this patient.   Past Surgical History:  Procedure Laterality Date  . NO PAST SURGERIES       OB History    Gravida  3   Para  2   Term  2   Preterm      AB  1   Living  2     SAB  1   TAB      Ectopic      Multiple      Live Births               Home Medications    Prior to Admission medications   Medication Sig Start Date End Date Taking? Authorizing Provider  cetirizine (ZYRTEC) 10 MG tablet Take 10 mg by mouth daily as needed for  allergies.   Yes [provider]  ibuprofen (ADVIL,MOTRIN) 600 MG tablet Take 1 tablet (600 mg total) by mouth every 6 (six) hours as needed. Patient not taking: Reported on 10/09/2016 09/24/16   Aviva Signs, CNM  naproxen (NAPROSYN) 500 MG tablet Take 1 tablet (500 mg total) by mouth 2 (two) times daily. 08/18/17   Maxwell Caul, PA-C    Family History Family History  Problem Relation Age of Onset  . Heart disease Mother   . Diabetes Father     Social History Social History   Tobacco Use  . Smoking status: Never Smoker  . Smokeless tobacco: Never Used  Substance Use Topics  . Alcohol use: No  . Drug use: No     Allergies   Shrimp [shellfish allergy]   Review of Systems Review of Systems  Constitutional: Negative for fever.  Respiratory: Negative for cough and shortness of breath.   Cardiovascular: Negative for chest pain.  Gastrointestinal: Positive for abdominal pain. Negative for nausea and vomiting.  Genitourinary: Positive for vaginal discharge. Negative for dysuria, hematuria and vaginal bleeding.  Neurological: Negative for headaches.  All other systems reviewed and  are negative.    Physical Exam Updated Vital Signs BP 108/61   Pulse 67   Temp 97.9 F (36.6 C) (Oral)   Resp 17   Ht 5' (1.524 m)   Wt 53.5 kg (118 lb)   LMP 07/28/2017   SpO2 100%   BMI 23.05 kg/m   Physical Exam  Constitutional: She is oriented to person, place, and time. She appears well-developed and well-nourished.  HENT:  Head: Normocephalic and atraumatic.  Mouth/Throat: Oropharynx is clear and moist and mucous membranes are normal.  Eyes: Pupils are equal, round, and reactive to light. Conjunctivae, EOM and lids are normal.  Neck: Full passive range of motion without pain.  Cardiovascular: Normal rate, regular rhythm, normal heart sounds and normal pulses. Exam reveals no gallop and no friction rub.  No murmur heard. Pulmonary/Chest: Effort normal and breath  sounds normal.  Abdominal: Soft. Normal appearance. There is tenderness in the suprapubic area. There is no rigidity, no guarding, no CVA tenderness and no tenderness at McBurney's point.  Abdomen is soft, non-distended. Tenderness noted to the suprapubic region. No rigidity, No guarding. No peritoneal signs.  Genitourinary: Vagina normal and uterus normal. Cervix exhibits discharge. Right adnexum displays tenderness. Right adnexum displays no mass. Left adnexum displays no mass and no tenderness.  Genitourinary Comments: The exam was performed with a chaperone present. Normal external female genitalia. No lesions, rash, or sores.  Pelvic exam reveals a small amount of mucousy/bloody discharge at the cervix.  No cervical friability.  No CMT.  Patient does have right adnexal tenderness.  No mass noted.  No left adnexal mass or tenderness.  Musculoskeletal: Normal range of motion.  Neurological: She is alert and oriented to person, place, and time.  Skin: Skin is warm and dry. Capillary refill takes less than 2 seconds.  Psychiatric: She has a normal mood and affect. Her speech is normal.  Nursing note and vitals reviewed.    ED Treatments / Results  Labs (all labs ordered are listed, but only abnormal results are displayed) Labs Reviewed  COMPREHENSIVE METABOLIC PANEL - Abnormal; Notable for the following components:      Result Value   Sodium 134 (*)    Potassium 3.1 (*)    All other components within normal limits  URINALYSIS, ROUTINE W REFLEX MICROSCOPIC - Abnormal; Notable for the following components:   Color, Urine STRAW (*)    Specific Gravity, Urine 1.002 (*)    Hgb urine dipstick MODERATE (*)    All other components within normal limits  WET PREP, GENITAL  LIPASE, BLOOD  CBC  I-STAT BETA HCG BLOOD, ED ([facility], [facility], [facility] ONLY)  GC/CHLAMYDIA PROBE AMP ([place]) NOT AT [facility]  WET PREP  (BD AFFIRM) ([place])    EKG None  Radiology US Transvaginal Non-ob  Result Date:  08/17/2017 CLINICAL DATA:  Pelvic pain for 2 weeks, history of ovarian cysts. EXAM: TRANSABDOMINAL AND TRANSVAGINAL ULTRASOUND OF PELVIS DOPPLER ULTRASOUND OF OVARIES TECHNIQUE: Both transabdominal and transvaginal ultrasound examinations of the pelvis were performed. Transabdominal technique was performed for global imaging of the pelvis including uterus, ovaries, adnexal regions, and pelvic cul-de-sac. It was necessary to proceed with endovaginal exam following the transabdominal exam to visualize the uterus, ovaries and adnexa. Color and duplex Doppler ultrasound was utilized to evaluate blood flow to the ovaries. COMPARISON:  Pelvic ultrasound 10/11/2016 FINDINGS: Uterus Measurements: 5.7 x 3.5 x 3.9 cm. No fibroids or other mass visualized. The uterus is retroverted. Endometrium Thickness: 6 mm, normal.  No focal abnormality visualized. Right ovary Measurements: 3.0 x 1.8 x 2.2 cm. There is an avascular 1.4 cm complex region in the right ovary. Normal blood flow. No adnexal mass. Left ovary Measurements: 2.5 x 1.4 x 1.2 cm. Normal appearance with normal blood flow. No adnexal mass. Pulsed Doppler evaluation of both ovaries demonstrates normal low-resistance arterial and venous waveforms. Other findings No abnormal free fluid. IMPRESSION: 1. Normal blood flow to both ovaries without torsion. 2. Small complex 1.4 cm region in the right ovary is likely an involuting corpus luteal cyst. This is of doubtful clinical significance, and no dedicated imaging follow-up is needed. 3. Unremarkable sonographic appearance of the uterus, which is retroverted. Electronically Signed   By: Rubye Oaks M.D.   On: 08/17/2017 23:52   US Pelvis Complete  Result Date: 08/17/2017 CLINICAL DATA:  Pelvic pain for 2 weeks, history of ovarian cysts. EXAM: TRANSABDOMINAL AND TRANSVAGINAL ULTRASOUND OF PELVIS DOPPLER ULTRASOUND OF OVARIES TECHNIQUE: Both transabdominal and transvaginal ultrasound examinations of the pelvis were  performed. Transabdominal technique was performed for global imaging of the pelvis including uterus, ovaries, adnexal regions, and pelvic cul-de-sac. It was necessary to proceed with endovaginal exam following the transabdominal exam to visualize the uterus, ovaries and adnexa. Color and duplex Doppler ultrasound was utilized to evaluate blood flow to the ovaries. COMPARISON:  Pelvic ultrasound 10/11/2016 FINDINGS: Uterus Measurements: 5.7 x 3.5 x 3.9 cm. No fibroids or other mass visualized. The uterus is retroverted. Endometrium Thickness: 6 mm, normal.  No focal abnormality visualized. Right ovary Measurements: 3.0 x 1.8 x 2.2 cm. There is an avascular 1.4 cm complex region in the right ovary. Normal blood flow. No adnexal mass. Left ovary Measurements: 2.5 x 1.4 x 1.2 cm. Normal appearance with normal blood flow. No adnexal mass. Pulsed Doppler evaluation of both ovaries demonstrates normal low-resistance arterial and venous waveforms. Other findings No abnormal free fluid. IMPRESSION: 1. Normal blood flow to both ovaries without torsion. 2. Small complex 1.4 cm region in the right ovary is likely an involuting corpus luteal cyst. This is of doubtful clinical significance, and no dedicated imaging follow-up is needed. 3. Unremarkable sonographic appearance of the uterus, which is retroverted. Electronically Signed   By: Rubye Oaks M.D.   On: 08/17/2017 23:52   [place] Art/ven Flow Abd Pelv Doppler  Result Date: 08/17/2017 CLINICAL DATA:  Pelvic pain for 2 weeks, history of ovarian cysts. EXAM: TRANSABDOMINAL AND TRANSVAGINAL ULTRASOUND OF PELVIS DOPPLER ULTRASOUND OF OVARIES TECHNIQUE: Both transabdominal and transvaginal ultrasound examinations of the pelvis were performed. Transabdominal technique was performed for global imaging of the pelvis including uterus, ovaries, adnexal regions, and pelvic cul-de-sac. It was necessary to proceed with endovaginal exam following the transabdominal exam to visualize  the uterus, ovaries and adnexa. Color and duplex Doppler ultrasound was utilized to evaluate blood flow to the ovaries. COMPARISON:  Pelvic ultrasound 10/11/2016 FINDINGS: Uterus Measurements: 5.7 x 3.5 x 3.9 cm. No fibroids or other mass visualized. The uterus is retroverted. Endometrium Thickness: 6 mm, normal.  No focal abnormality visualized. Right ovary Measurements: 3.0 x 1.8 x 2.2 cm. There is an avascular 1.4 cm complex region in the right ovary. Normal blood flow. No adnexal mass. Left ovary Measurements: 2.5 x 1.4 x 1.2 cm. Normal appearance with normal blood flow. No adnexal mass. Pulsed Doppler evaluation of both ovaries demonstrates normal low-resistance arterial and venous waveforms. Other findings No abnormal free fluid. IMPRESSION: 1. Normal blood flow to both ovaries without torsion. 2. Small complex  1.4 cm region in the right ovary is likely an involuting corpus luteal cyst. This is of doubtful clinical significance, and no dedicated imaging follow-up is needed. 3. Unremarkable sonographic appearance of the uterus, which is retroverted. Electronically Signed   By: Rubye OaksMelanie  Ehinger M.D.   On: 08/17/2017 23:52    Procedures Procedures (including critical care time)  Medications Ordered in ED Medications - No data to display   Initial Impression / Assessment and Plan / ED Course  I have reviewed the triage vital signs and the nursing notes.  Pertinent labs & imaging results that were available during my care of the patient were reviewed by me and considered in my medical decision making (see chart for details).     32 y.o. F with PMH/o Ovarian cyst who presents for evaluation of lower abdominal pain that is been intermittently ongoing for last 2.5 weeks.  She reports that pain is intermittent.  She states there is nothing triggers the pain to come on.  She states it resolves on her own.  She states she has been taking occasional hydrocodone for relief of pain.  She states she had pain  similar to this a few years ago and it was an ovarian cyst.  Is been able to eat and drink without any difficulty.  No nausea/vomiting, fevers. Patient is afebrile, non-toxic appearing, sitting comfortably on examination table. Vital signs reviewed and stable.  On exam, patient is tenderness palpation noted to the suprapubic region.  No tenderness at McBurney's point.  Consider UTI versus STD versus ovarian cyst.  Do not suspect ovarian torsion given duration of symptoms and presentation.  History/physical exam is not concerning for appendicitis given duration of symptoms.  Initial labs ordered at triage.    I-STAT beta negative.  CMP shows sodium of 134, potassium 3.1.  Otherwise unremarkable.  UA shows moderate hemoglobin but otherwise unremarkable.  Patient negative.  CBC without any significant leukocytosis, anemia.  Pelvic exam shows small amount of discharge at the cervix.  No CMT.  There is right adnexal tenderness.  No mass.  No left adnexal mass or tenderness.  We will plan for ultrasound evaluation.  Ultrasound reviewed.  No evidence of torsion.  There is a small complex cyst noted on the right ovary.  At this time, patient symptoms have been going on for 2.5 weeks.  She has no evidence of leukocytosis or normalities on lab work.  Additionally, patient has no fever.  She has been able to tolerate p.o. without any difficulty.  I do not suspect appendicitis, diverticulitis, perforation, bowel obstruction based on history/physical exam and duration of symptoms.  Discussed results with patient.  She is currently not having any pain at this time.  Repeat abdominal exam shows no pain or tenderness on abdominal exam.  Vital signs are stable.  I discussed work-up with patient.  I discussed with patient that right now, she does not appear to have any acute infectious etiology that would require CT abdomen pelvis but offered to do a CT on pelvis if she wished.  We discussed at length regarding risk first  benefits.  Patient wishes to decline CT and pelvis at this time.  Patient exhibits full medical understanding of risk first benefits.  Additionally, I discussed treatment options regarding STD treatment. Offerred treatment here in the ED but patient wishes to wait until STD testing is back. Encouraged patient to follow-up with OB/GYN. Patient had ample opportunity for questions and discussion. All patient's questions were answered with  full understanding. Strict return precautions discussed. Patient expresses understanding and agreement to plan.   Final Clinical Impressions(s) / ED Diagnoses   Final diagnoses:  Cyst of right ovary  Lower abdominal pain    ED Discharge Orders        Ordered    naproxen (NAPROSYN) 500 MG tablet  2 times daily     08/18/17 0024       Maxwell Caul, PA-C 08/18/17 0150    Bethann Berkshire, MD 08/19/17 1032

## 2017-08-17 NOTE — ED Notes (Signed)
Results reviewed.  No changes in acuity at this time 

## 2017-08-18 LAB — GC/CHLAMYDIA PROBE AMP (~~LOC~~) NOT AT ARMC
Chlamydia: NEGATIVE
Neisseria Gonorrhea: NEGATIVE

## 2017-08-18 MED ORDER — NAPROXEN 500 MG PO TABS: 500.0000 mg | ORAL_TABLET | Freq: Two times a day (BID) | ORAL | 0 refills | Status: DC

## 2017-08-18 NOTE — Discharge Instructions (Addendum)
You can take Tylenol or Ibuprofen as directed for pain. You can alternate Tylenol and Ibuprofen every 4 hours. If you take Tylenol at 1pm, then you can take Ibuprofen at 5pm. Then you can take Tylenol again at 9pm.   If that doesn't work, you can take the Naprosyn.   As we discussed, you need to follow-up with an OB/GYN for further evaluation.  You can either follow-up with your OB/GYN or ours.  Return the emergency department if your abdominal pain becomes more severe, becomes more constant, you have fever, nausea/vomiting, chest pain, difficulty breathing or any other worsening or concerning symptoms.

## 2017-08-24 ENCOUNTER — Encounter (HOSPITAL_COMMUNITY): Payer: Self-pay | Admitting: Emergency Medicine

## 2017-08-24 ENCOUNTER — Emergency Department (HOSPITAL_COMMUNITY)
Admission: EM | Admit: 2017-08-24 | Discharge: 2017-08-24 | Disposition: A | Discharge status: Home / Self Care | Payer: BLUE CROSS/BLUE SHIELD | Attending: Emergency Medicine | Admitting: Emergency Medicine

## 2017-08-24 ENCOUNTER — Other Ambulatory Visit: Payer: Self-pay

## 2017-08-24 ENCOUNTER — Emergency Department (HOSPITAL_COMMUNITY): Payer: BLUE CROSS/BLUE SHIELD

## 2017-08-24 DIAGNOSIS — R252 Cramp and spasm: Secondary | ICD-10-CM | POA: Insufficient documentation

## 2017-08-24 DIAGNOSIS — F418 Other specified anxiety disorders: Secondary | ICD-10-CM

## 2017-08-24 DIAGNOSIS — R064 Hyperventilation: Secondary | ICD-10-CM | POA: Insufficient documentation

## 2017-08-24 DIAGNOSIS — F419 Anxiety disorder, unspecified: Secondary | ICD-10-CM | POA: Insufficient documentation

## 2017-08-24 DIAGNOSIS — R0602 Shortness of breath: Secondary | ICD-10-CM | POA: Diagnosis present

## 2017-08-24 LAB — BASIC METABOLIC PANEL
Anion gap: 17 — ABNORMAL HIGH (ref 5–15)
BUN: 12 mg/dL (ref 6–20)
CO2: 19 mmol/L — ABNORMAL LOW (ref 22–32)
Calcium: 10 mg/dL (ref 8.9–10.3)
Chloride: 101 mmol/L (ref 98–111)
Creatinine, Ser: 0.67 mg/dL (ref 0.44–1.00)
GFR calc Af Amer: >60 mL/min (ref >60)
GFR calc non Af Amer: >60 mL/min (ref >60)
Glucose, Bld: 105 mg/dL — ABNORMAL HIGH (ref 70–99)
Potassium: 3.6 mmol/L (ref 3.5–5.1)
Sodium: 137 mmol/L (ref 135–145)

## 2017-08-24 LAB — CBC
HCT: 43.5 % (ref 36.0–46.0)
Hemoglobin: 14.4 g/dL (ref 12.0–15.0)
MCH: 29.3 pg (ref 26.0–34.0)
MCHC: 33.1 g/dL (ref 30.0–36.0)
MCV: 88.4 fL (ref 78.0–100.0)
Platelets: 339 10*3/uL (ref 150–400)
RBC: 4.92 MIL/uL (ref 3.87–5.11)
RDW: 12 % (ref 11.5–15.5)
WBC: 11 10*3/uL — ABNORMAL HIGH (ref 4.0–10.5)

## 2017-08-24 LAB — I-STAT TROPONIN, ED: Troponin i, poc: 0 ng/mL (ref 0.00–0.08)

## 2017-08-24 LAB — I-STAT BETA HCG BLOOD, ED (MC, WL, AP ONLY): I-stat hCG, quantitative: <5 m[IU]/mL (ref <5)

## 2017-08-24 NOTE — ED Notes (Signed)
Patient verbalizes understanding of discharge instructions. Opportunity for questioning and answers were provided. 

## 2017-08-24 NOTE — ED Notes (Signed)
Pt currently in X-ray.

## 2017-08-24 NOTE — ED Triage Notes (Signed)
Pt presents with sob, hyperventilation, hand cramping, and tingling that began 10-15 mins PTA; pt denies CP, abd pain

## 2017-08-24 NOTE — ED Triage Notes (Signed)
pts family at bedside states they were at a funeral and she began having these symptoms

## 2017-08-30 ENCOUNTER — Encounter (HOSPITAL_COMMUNITY): Payer: Self-pay | Admitting: Emergency Medicine

## 2017-08-30 ENCOUNTER — Emergency Department (HOSPITAL_COMMUNITY): Payer: BLUE CROSS/BLUE SHIELD

## 2017-08-30 ENCOUNTER — Other Ambulatory Visit: Payer: Self-pay

## 2017-08-30 ENCOUNTER — Emergency Department (HOSPITAL_COMMUNITY)
Admission: EM | Admit: 2017-08-30 | Discharge: 2017-08-30 | Disposition: A | Discharge status: Home / Self Care | Payer: BLUE CROSS/BLUE SHIELD | Attending: Emergency Medicine | Admitting: Emergency Medicine

## 2017-08-30 DIAGNOSIS — F410 Panic disorder [episodic paroxysmal anxiety] without agoraphobia: Secondary | ICD-10-CM | POA: Insufficient documentation

## 2017-08-30 DIAGNOSIS — Z79899 Other long term (current) drug therapy: Secondary | ICD-10-CM | POA: Diagnosis not present

## 2017-08-30 DIAGNOSIS — R0602 Shortness of breath: Secondary | ICD-10-CM | POA: Diagnosis present

## 2017-08-30 DIAGNOSIS — R1031 Right lower quadrant pain: Secondary | ICD-10-CM | POA: Insufficient documentation

## 2017-08-30 LAB — URINALYSIS, ROUTINE W REFLEX MICROSCOPIC
BACTERIA UA: NONE SEEN
Bilirubin Urine: NEGATIVE
GLUCOSE, UA: NEGATIVE mg/dL
Hgb urine dipstick: NEGATIVE
KETONES UR: NEGATIVE mg/dL
Nitrite: NEGATIVE
Protein, ur: NEGATIVE mg/dL
SPECIFIC GRAVITY, URINE: 1.009 (ref 1.005–1.030)
pH: 8 (ref 5.0–8.0)

## 2017-08-30 LAB — RAPID URINE DRUG SCREEN, HOSP PERFORMED
AMPHETAMINES: NOT DETECTED
BENZODIAZEPINES: NOT DETECTED
Barbiturates: NOT DETECTED
COCAINE: NOT DETECTED
OPIATES: NOT DETECTED
TETRAHYDROCANNABINOL: NOT DETECTED

## 2017-08-30 LAB — BASIC METABOLIC PANEL
Anion gap: 14 (ref 5–15)
BUN: 10 mg/dL (ref 6–20)
CALCIUM: 9.9 mg/dL (ref 8.9–10.3)
CO2: 22 mmol/L (ref 22–32)
Chloride: 102 mmol/L (ref 98–111)
Creatinine, Ser: 0.7 mg/dL (ref 0.44–1.00)
GLUCOSE: 108 mg/dL — AB (ref 70–99)
Potassium: 3.6 mmol/L (ref 3.5–5.1)
Sodium: 138 mmol/L (ref 135–145)

## 2017-08-30 LAB — CBC
HEMATOCRIT: 43.7 % (ref 36.0–46.0)
Hemoglobin: 14.4 g/dL (ref 12.0–15.0)
MCH: 29.1 pg (ref 26.0–34.0)
MCHC: 33 g/dL (ref 30.0–36.0)
MCV: 88.3 fL (ref 78.0–100.0)
Platelets: 294 10*3/uL (ref 150–400)
RBC: 4.95 MIL/uL (ref 3.87–5.11)
RDW: 11.9 % (ref 11.5–15.5)
WBC: 8.6 10*3/uL (ref 4.0–10.5)

## 2017-08-30 LAB — I-STAT BETA HCG BLOOD, ED (MC, WL, AP ONLY): I-stat hCG, quantitative: <5 m[IU]/mL (ref <5)

## 2017-08-30 LAB — I-STAT TROPONIN, ED: TROPONIN I, POC: 0 ng/mL (ref 0.00–0.08)

## 2017-08-30 MED ORDER — IOHEXOL 300 MG/ML  SOLN
100.0000 mL | Freq: Once | INTRAMUSCULAR | Status: AC | PRN
  Administered 2017-08-30: 100 mL via INTRAVENOUS

## 2017-08-30 MED ORDER — SODIUM CHLORIDE 0.9 % IV BOLUS
1000.0000 mL | Freq: Once | INTRAVENOUS | Status: AC
  Administered 2017-08-30: 1000 mL via INTRAVENOUS

## 2017-08-30 MED ORDER — LORAZEPAM 1 MG PO TABS
1.0000 mg | ORAL_TABLET | Freq: Once | ORAL | Status: AC
  Administered 2017-08-30: 1 mg via ORAL
  Filled 2017-08-30: qty 1

## 2017-08-30 NOTE — ED Provider Notes (Signed)
[facility] EMERGENCY DEPARTMENT Provider Note   CSN: [id number] Arrival date & time: 08/30/17  1015   History   Chief Complaint Chief Complaint  Patient presents with  . Shortness of Breath    HPI Jamie Ponce is a 32 y.o. female with a history of a right ovarian cyst who presents with shortness of breath, abdominal pain, and vomiting. She reports that the shortness of breath is the most worrisome symptom for her. It feels like there is something caught in her throat. She endorses associated dizziness (room spinning; both when sitting andstanding), feeling like her heart is racing, diaphoresis, and numbness and tingling in her hands and feet. This is the second time she has had an episode like this. The last episode was on 7/14 and she was seen in the ED for this problem. At that time, panic attack was suspected. Jamie Ponce also complains of lower abdominal pain and vomiting that started 7/17. She reports 3 episodes of non-bloody emesis per day and a feeling of abdominal fullness. She was seen in the ED on 7/7 for similar abdominal pain without vomiting and her right ovarian cyst was visualized by ultrasound without complication. She denies fevers, chest pain, dysuria, hematuria, vaginal bleeding, vaginal discharge, and flank pain. She still has her appendix and has not had any abdominal surgeries.  Past Medical History:  Diagnosis Date  . Ovarian cyst     There are no active problems to display for this patient.   Past Surgical History:  Procedure Laterality Date  . NO PAST SURGERIES       OB History    Gravida  3   Para  2   Term  2   Preterm      AB  1   Living  2     SAB  1   TAB      Ectopic      Multiple      Live Births               Home Medications    Prior to Admission medications   Medication Sig Start Date End Date Taking? Authorizing Provider  ibuprofen (ADVIL,MOTRIN) 600 MG tablet Take 1 tablet (600 mg total) by mouth every 6  (six) hours as needed. Patient taking differently: Take 400 mg by mouth every 6 (six) hours as needed.  09/24/16  Yes Aviva Signs, CNM  naproxen (NAPROSYN) 500 MG tablet Take 1 tablet (500 mg total) by mouth 2 (two) times daily. Patient not taking: Reported on 08/30/2017 08/18/17   Maxwell Caul, PA-C    Family History Family History  Problem Relation Age of Onset  . Heart disease Mother   . Diabetes Father     Social History Social History   Tobacco Use  . Smoking status: Never Smoker  . Smokeless tobacco: Never Used  Substance Use Topics  . Alcohol use: No  . Drug use: No     Allergies   Shrimp [shellfish allergy]   Review of Systems Review of Systems  Constitutional: Positive for diaphoresis. Negative for chills and fever.  HENT: Negative for rhinorrhea and sore throat.   Eyes: Negative for visual disturbance.  Respiratory: Positive for shortness of breath. Negative for cough.   Cardiovascular: Negative for chest pain and leg swelling.  Gastrointestinal: Positive for abdominal pain, nausea and vomiting. Negative for diarrhea.  Genitourinary: Negative for difficulty urinating, dysuria, flank pain, hematuria, vaginal bleeding and vaginal discharge.  Musculoskeletal: Negative  for back pain and neck pain.  Skin: Positive for rash. Negative for wound.  Neurological: Positive for dizziness, light-headedness and numbness. Negative for weakness.  Psychiatric/Behavioral:       Denies anxiety.     Physical Exam Updated Vital Signs BP (!) 101/59   Pulse 68   Temp 98.5 F (36.9 C)   Resp 14   Ht 5' (1.524 m)   Wt 53.5 kg (118 lb)   SpO2 100%   BMI 23.05 kg/m   Physical Exam  Constitutional: She is oriented to person, place, and time. She appears well-developed and well-nourished. No distress.  HENT:  Head: Normocephalic and atraumatic.  Eyes: Pupils are equal, round, and reactive to light. EOM are normal.  Neck: Normal range of motion.  Cardiovascular:  Normal rate and regular rhythm. Exam reveals no gallop and no friction rub.  No murmur heard. Pulmonary/Chest: Effort normal and breath sounds normal. No respiratory distress. She has no wheezes. She has no rhonchi. She has no rales.  Abdominal: Soft. Bowel sounds are normal. There is no rebound and no guarding.  Mild suprapubic tenderness. Moderate RLQ tenderness.  Musculoskeletal:       Right lower leg: She exhibits no edema.       Left lower leg: She exhibits no edema.  Neurological: She is alert and oriented to person, place, and time. She has normal strength. No cranial nerve deficit or sensory deficit.  Skin: Skin is warm and dry. Capillary refill takes less than 2 seconds.  Erythematous, non-scaly, non-crusted plaque over right collar bone. Crescent-shaped eruption on right neck.   Psychiatric: She has a normal mood and affect. Her behavior is normal.    ED Treatments / Results  Labs (all labs ordered are listed, but only abnormal results are displayed) Labs Reviewed  BASIC METABOLIC PANEL - Abnormal; Notable for the following components:      Result Value   Glucose, Bld 108 (*)    All other components within normal limits  URINALYSIS, ROUTINE W REFLEX MICROSCOPIC - Abnormal; Notable for the following components:   Leukocytes, UA TRACE (*)    All other components within normal limits  CBC  RAPID URINE DRUG SCREEN, HOSP PERFORMED  I-STAT TROPONIN, ED  I-STAT BETA HCG BLOOD, ED ([facility], [facility], [facility] ONLY)    EKG None  Radiology Dg Chest 2 View  Result Date: 08/30/2017 CLINICAL DATA:  Shortness of breath and abdominal pain beginning today. EXAM: CHEST - 2 VIEW COMPARISON:  03/10/2014. FINDINGS: Normal sized heart. Clear lungs with normal vascularity. Unremarkable bones. IMPRESSION: Normal examination. Electronically Signed   By: Beckie SaltsSteven  Reid M.D.   On: 08/30/2017 10:51   Ct Abdomen Pelvis W Contrast  Result Date: 08/30/2017 CLINICAL DATA:  Shortness of breath and abdominal pain  left lower quadrant while at work today. EXAM: CT ABDOMEN AND PELVIS WITH CONTRAST TECHNIQUE: Multidetector CT imaging of the abdomen and pelvis was performed using the standard protocol following bolus administration of intravenous contrast. CONTRAST:  100mL OMNIPAQUE IOHEXOL 300 MG/ML  SOLN COMPARISON:  05/31/2013 FINDINGS: Lower chest: Normal. Hepatobiliary: Normal. Pancreas: Normal. Spleen: Normal. Adrenals/Urinary Tract: Adrenal glands are normal. Kidneys are normal in size without hydronephrosis or nephrolithiasis. Ureters are normal. Bladder is somewhat decompressed with mild ill definition of the wall subtle stranding of the adjacent fat as findings could be due to cystitis. Stomach/Bowel: Stomach and small bowel are normal. Appendix is normal. Colon is normal. Vascular/Lymphatic: Normal. Reproductive: Uterus is retroverted.  Adnexal regions normal. Other: No  free fluid or focal inflammatory change. Musculoskeletal: Within normal. IMPRESSION: Decompressed bladder with mild ill definition of the wall and subtle stranding of the adjacent fat as findings could be due to cystitis. Otherwise, no acute findings in the abdomen/pelvis. Electronically Signed   By: Elberta Fortis M.D.   On: 08/30/2017 13:54    Procedures Procedures (including critical care time)  Medications Ordered in ED Medications  LORazepam (ATIVAN) tablet 1 mg (1 mg Oral Given 08/30/17 1213)  sodium chloride 0.9 % bolus 1,000 mL (0 mLs Intravenous Stopped 08/30/17 1420)  iohexol (OMNIPAQUE) 300 MG/ML solution 100 mL (100 mLs Intravenous Contrast Given 08/30/17 1322)     Initial Impression / Assessment and Plan / ED Course  I have reviewed the triage vital signs and the nursing notes.  Pertinent labs & imaging results that were available during my care of the patient were reviewed by me and considered in my medical decision making (see chart for details).  Searra Mervin is a 32 y.o. female with a history of a right ovarian cyst who  presents with shortness of breath, abdominal pain, and vomiting. The shortness of breath is associated with dizziness, heart racing, and numbness in her fingers and toes. This sounds very similar to her previous episode 2 weeks ago and is characteristic of a panic attack. Her RLQ pain with vomiting may or may not be related to the panic attacks. When she was evaluated on 7/7 for abdominal pain, US revealed a small right ovarian cyst, GT/CT was negative, and UA was negative. Upon arrival to the ED today, patient was afebrile, hemodynamically stable, and satting appropriately on room air. Physical exam was significant for RLQ tenderness without guarding or rebound. Labs were significant for negative troponin, negative UA, negative UDS. EKG unremarkable. CXR normal. CT abdomen/pelvis obtained to rule out appendicitis, ovarian cyst rupture, ovarian cyst infection. CT was negative. Abdominal pain and vomiting may be related to her nausea. Pt medically safe for discharge home with return precautions of worsening shortness of breath, worsening abdominal pain, inability to tolerate PO, and fever. Pt advised to establish care with a PCP to address panic attacks in the outpatient setting.  Final Clinical Impressions(s) / ED Diagnoses   Final diagnoses:  Panic attack  Right lower quadrant abdominal pain    ED Discharge Orders    None       Arieonna Medine, Cathleen Corti, MD 08/30/17 1541    Jacalyn Lefevre, MD 08/30/17 [phone]

## 2017-08-30 NOTE — Discharge Instructions (Signed)
Your labs and abdominal CT were normal and have ruled out any life-threatening causes of your abdominal pain. Please return to the ED if the abdominal pain gets worse or if you're unable to keep down food or water. The shortness of breath, dizziness, numbness/tingling, and feeling like your heart are racing likely represent a panic attack. Please establish care with a primary care provider (number provided) to work up and treat this problem in the outpatient setting.

## 2017-08-30 NOTE — ED Notes (Signed)
Patient verbalizes understanding of discharge instructions. Opportunity for questioning and answers were provided. Armband removed by staff, pt discharged from ED.  

## 2017-08-30 NOTE — ED Notes (Signed)
Patient transported to CT 

## 2017-08-30 NOTE — ED Triage Notes (Signed)
Patent complains of shortness of breath and abdominal pain that started while at work today. Denies other complaints. Reports similar episode last week that resolved spontaneously. Patient alert, oriented, and in no apparent distress at this time.

## 2017-08-30 NOTE — ED Notes (Signed)
Pt returns from ct scan. 

## 2017-09-04 NOTE — ED Provider Notes (Signed)
[facility]   CSN: [id number] Arrival date & time: 08/24/17  1931     History   Chief Complaint Chief Complaint  Patient presents with  . Shortness of Breath  . Hyperventilating  . cramping    HPI Jamie Ponce is a 32 y.o. female.  HPI   32 year old female with numerous complaints.  Just prior to arrival she was at a funeral.  After reviewing the body she went to sit down when she began feeling very cold.  Feel that she was feeling short of breath and family reports that she was breathing very quickly.  She began having numbness in her face and hands.  He became extremely anxious/concerned and felt that the soul of the deceased may have entered into her body.  Past Medical History:  Diagnosis Date  . Ovarian cyst     There are no active problems to display for this patient.   Past Surgical History:  Procedure Laterality Date  . NO PAST SURGERIES       OB History    Gravida  3   Para  2   Term  2   Preterm      AB  1   Living  2     SAB  1   TAB      Ectopic      Multiple      Live Births               Home Medications    Prior to Admission medications   Medication Sig Start Date End Date Taking? Authorizing Provider  naproxen (NAPROSYN) 500 MG tablet Take 1 tablet (500 mg total) by mouth 2 (two) times daily. Patient not taking: Reported on 08/30/2017 08/18/17  Yes Graciella FreerLayden, Lindsey A, PA-C  ibuprofen (ADVIL,MOTRIN) 600 MG tablet Take 1 tablet (600 mg total) by mouth every 6 (six) hours as needed. Patient taking differently: Take 400 mg by mouth every 6 (six) hours as needed.  09/24/16   Aviva SignsWilliams, Marie L, CNM    Family History Family History  Problem Relation Age of Onset  . Heart disease Mother   . Diabetes Father     Social History Social History   Tobacco Use  . Smoking status: Never Smoker  . Smokeless tobacco: Never Used  Substance Use Topics  . Alcohol use: No  . Drug use:  No     Allergies   Shrimp [shellfish allergy]   Review of Systems Review of Systems All systems reviewed and negative, other than as noted in HPI.   Physical Exam Updated Vital Signs BP 107/68   Pulse 64   Temp 98.1 F (36.7 C) (Oral)   Resp 14   Ht 5' (1.524 m)   Wt 53.5 kg (118 lb)   LMP 07/25/2017   SpO2 100%   BMI 23.05 kg/m   Physical Exam  Constitutional: She appears well-developed and well-nourished. No distress.  HENT:  Head: Normocephalic and atraumatic.  Eyes: Conjunctivae are normal. Right eye exhibits no discharge. Left eye exhibits no discharge.  Neck: Neck supple.  Cardiovascular: Normal rate, regular rhythm and normal heart sounds. Exam reveals no gallop and no friction rub.  No murmur heard. Pulmonary/Chest: Effort normal and breath sounds normal. No respiratory distress.  Abdominal: Soft. She exhibits no distension. There is no tenderness.  Musculoskeletal: She exhibits no edema or tenderness.  Neurological: She is alert.  Skin: Skin is warm and dry.  Psychiatric: She  has a normal mood and affect. Her behavior is normal. Thought content normal.  Nursing note and vitals reviewed.    ED Treatments / Results  Labs (all labs ordered are listed, but only abnormal results are displayed) Labs Reviewed  BASIC METABOLIC PANEL - Abnormal; Notable for the following components:      Result Value   CO2 19 (*)    Glucose, Bld 105 (*)    Anion gap 17 (*)    All other components within normal limits  CBC - Abnormal; Notable for the following components:   WBC 11.0 (*)    All other components within normal limits  I-STAT TROPONIN, ED  I-STAT BETA HCG BLOOD, ED ([facility], [facility], [facility] ONLY)    EKG EKG Interpretation  Date/Time:  Sunday August 24 2017 20:59:21 EDT Ventricular Rate:  69 PR Interval:    QRS Duration: 93 QT Interval:  401 QTC Calculation: 430 R Axis:   89 Text Interpretation:  Sinus rhythm Borderline short PR interval Borderline T abnormalities,  anterior leads Confirmed by Yelverton, David ([id number]) on 08/25/2017 7:25:02 PM   Radiology No results found.  Procedures Procedures (including critical care time)  Medications Ordered in ED Medications - No data to display   Initial Impression / Assessment and Plan / ED Course  I have reviewed the triage vital signs and the nursing notes.  Pertinent labs & imaging results that were available during my care of the patient were reviewed by me and considered in my medical decision making (see chart for details).     32  year old female with symptoms which seem very consistent with anxiety in a stressful situation and the viewing of a deceased person.  Symptoms are now pretty much completely resolved and she feels much better.  I doubt emergent process.  Encouragement provided.  Emergent return precautions discussed.  Final Clinical Impressions(s) / ED Diagnoses   Final diagnoses:  Situational anxiety    ED Discharge Orders    None       Raeford Razor, MD 09/04/17 [phone]

## 2017-09-11 ENCOUNTER — Inpatient Hospital Stay: Payer: BLUE CROSS/BLUE SHIELD

## 2017-09-11 NOTE — Progress Notes (Deleted)
Patient ID: Jamie Ponce, female   DOB: [birth year], 32 y.o.   MRN: [id number]  Seen in ED most recently 08/30/2017 for panic attack, N/V.    From ED note: Jamie Ponce is a 32 y.o. female with a history of a right ovarian cyst who presents with shortness of breath, abdominal pain, and vomiting. She reports that the shortness of breath is the most worrisome symptom for her. It feels like there is something caught in her throat. She endorses associated dizziness (room spinning; both when sitting andstanding), feeling like her heart is racing, diaphoresis, and numbness and tingling in her hands and feet. This is the second time she has had an episode like this. The last episode was on 7/14 and she was seen in the ED for this problem. At that time, panic attack was suspected. Jamie Ponce also complains of lower abdominal pain and vomiting that started 7/17. She reports 3 episodes of non-bloody emesis per day and a feeling of abdominal fullness. She was seen in the ED on 7/7 for similar abdominal pain without vomiting and her right ovarian cyst was visualized by ultrasound without complication. She denies fevers, chest pain, dysuria, hematuria, vaginal bleeding, vaginal discharge, and flank pain. She still has her appendix and has not had any abdominal surgeries.   From A/P: Jamie Ponce is a 32 y.o. female with a history of a right ovarian cyst who presents with shortness of breath, abdominal pain, and vomiting. The shortness of breath is associated with dizziness, heart racing, and numbness in her fingers and toes. This sounds very similar to her previous episode 2 weeks ago and is characteristic of a panic attack. Her RLQ pain with vomiting may or may not be related to the panic attacks. When she was evaluated on 7/7 for abdominal pain, US revealed a small right ovarian cyst, GT/CT was negative, and UA was negative. Upon arrival to the ED today, patient was afebrile, hemodynamically stable, and satting appropriately on  room air. Physical exam was significant for RLQ tenderness without guarding or rebound. Labs were significant for negative troponin, negative UA, negative UDS. EKG unremarkable. CXR normal. CT abdomen/pelvis obtained to rule out appendicitis, ovarian cyst rupture, ovarian cyst infection. CT was negative. Abdominal pain and vomiting may be related to her nausea. Pt medically safe for discharge home with return precautions of worsening shortness of breath, worsening abdominal pain, inability to tolerate PO, and fever. Pt advised to establish care with a PCP to address panic attacks in the outpatient setting.

## 2019-03-14 IMAGING — US US OB TRANSVAGINAL
1 series · 15 of 28 positions shown · non-contrast
Comparison: None.

CLINICAL DATA: Abdominal pain and vaginal bleeding for 2 days.
Gestational age by LMP of 9 weeks 6 days.

EXAM:
OBSTETRIC <14 WK US AND TRANSVAGINAL OB US
TECHNIQUE: Both transabdominal and transvaginal ultrasound examinations were
performed for complete evaluation of the gestation as well as the
maternal uterus, adnexal regions, and pelvic cul-de-sac.
Transvaginal technique was performed to assess early pregnancy.

[Series 1: us ob transvaginal · 32 acquisitions, 15 frames shown]
[im 1/32]
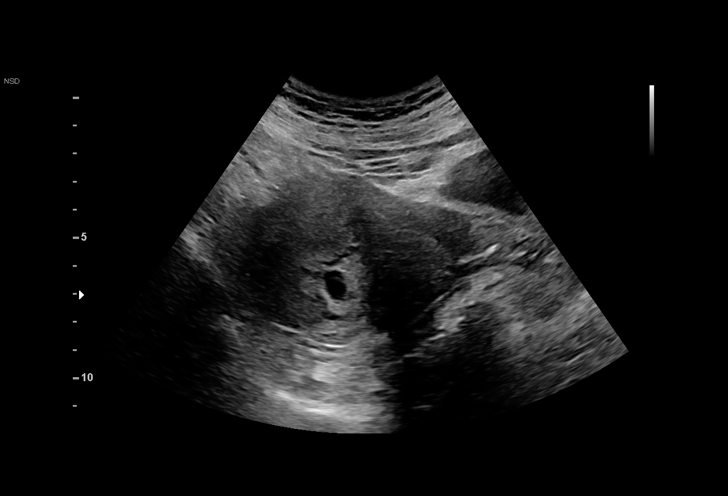
[im 3/32]
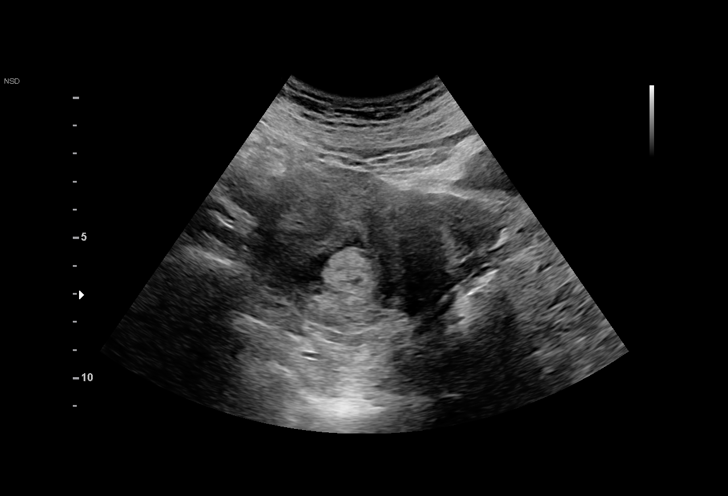
[im 5/32]
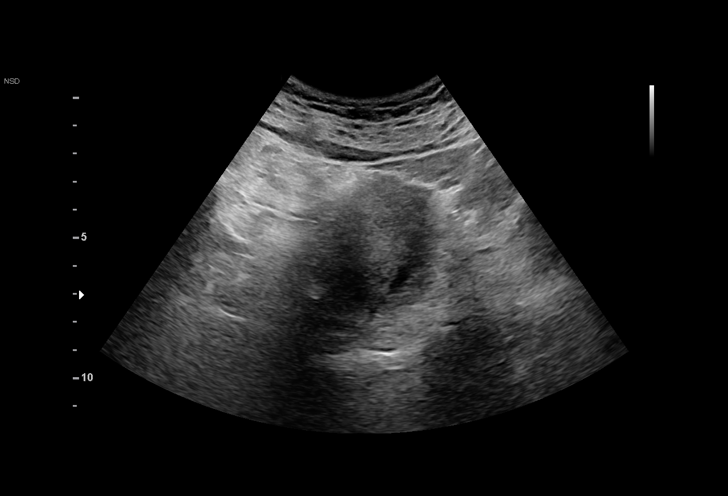
[im 7/32]
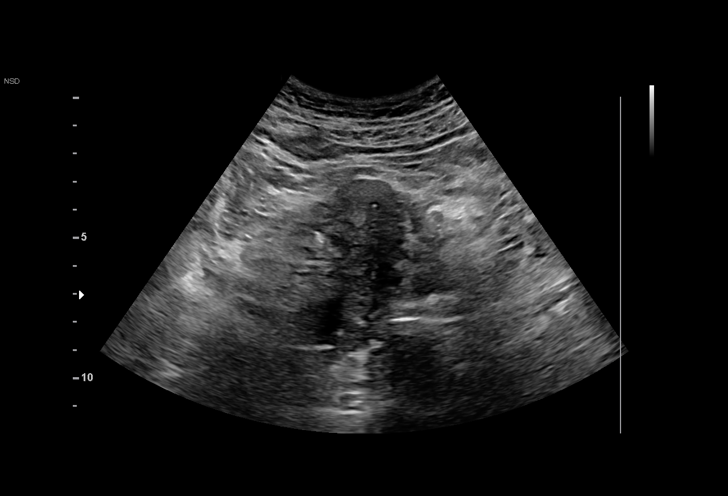
[im 10/32]
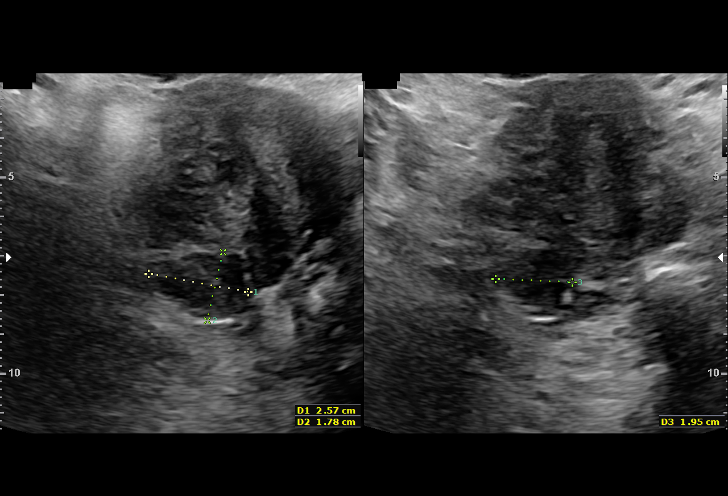
[im 12/32]
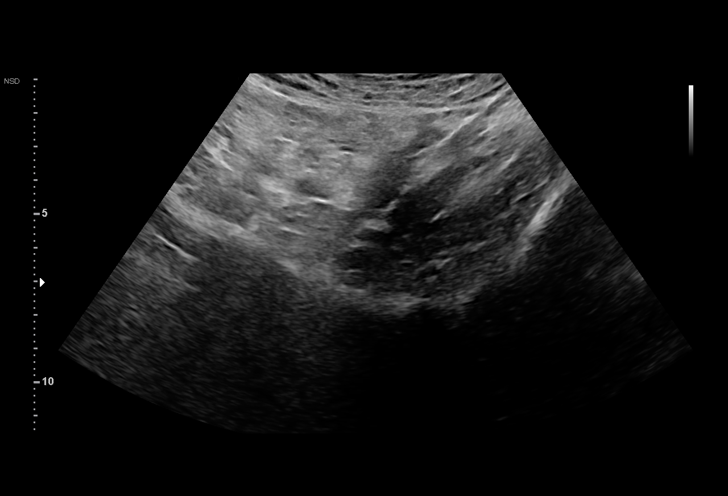
[im 14/32]
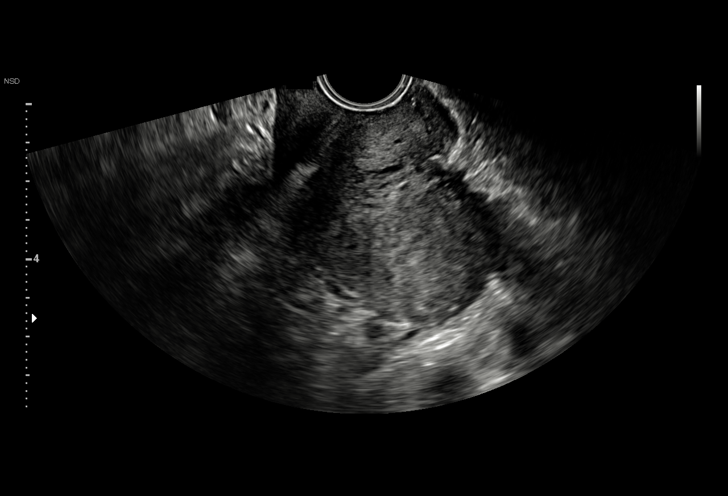
[im 17/32]
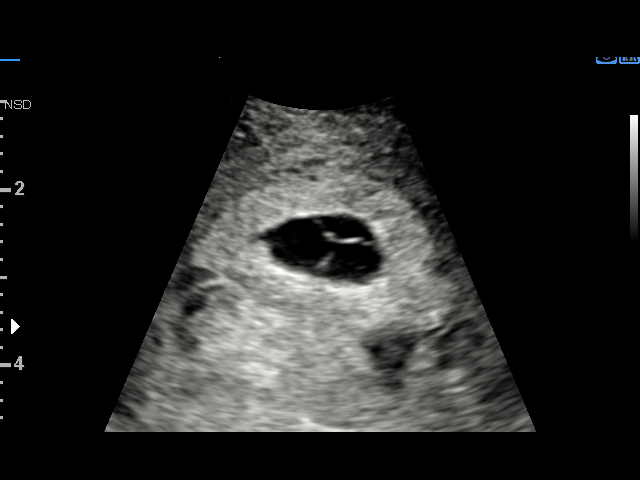
[im 18/32]
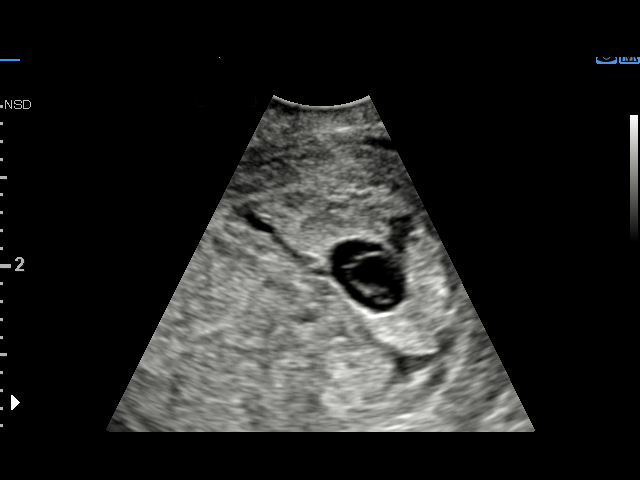
[im 20/32]
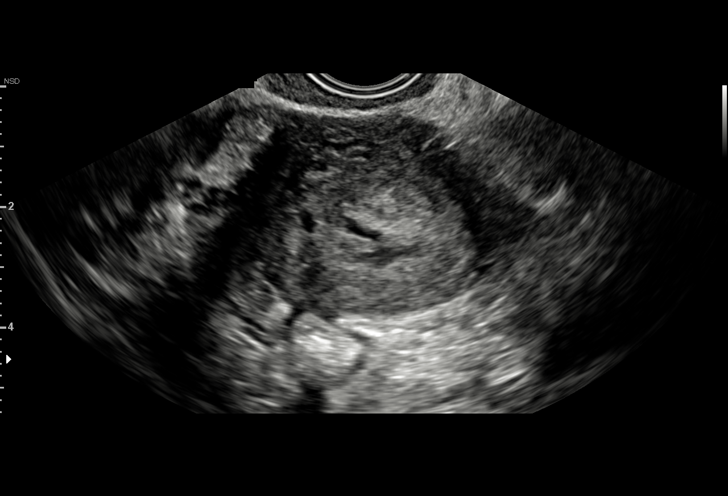
[im 22/32]
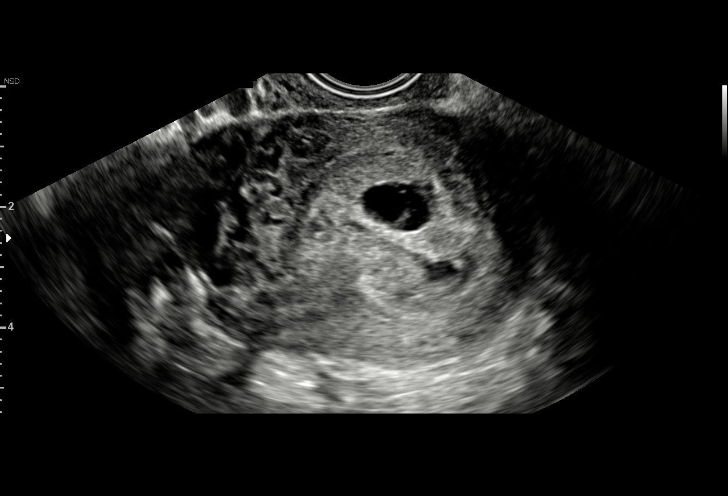
[im 25/32]
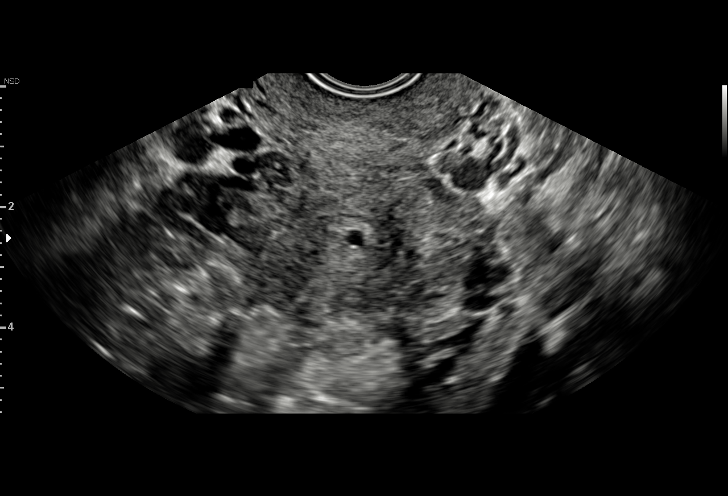
[im 27/32]
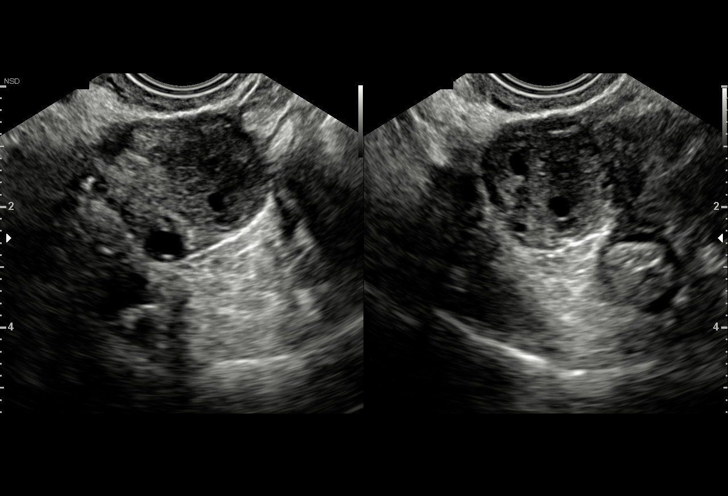
[im 29/32]
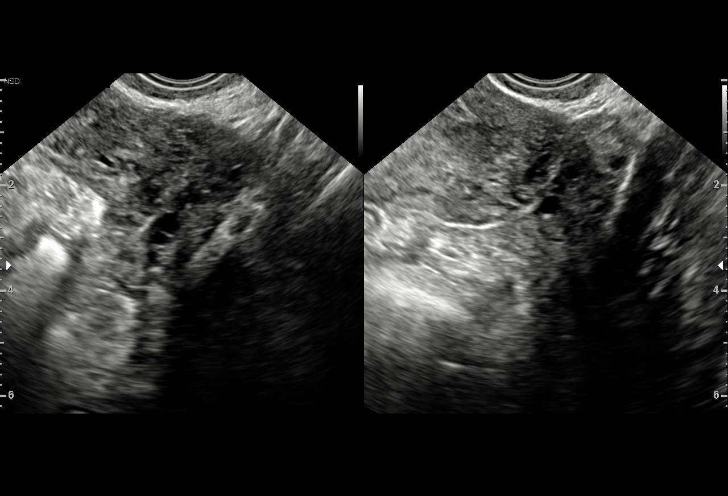
[im 32/32]
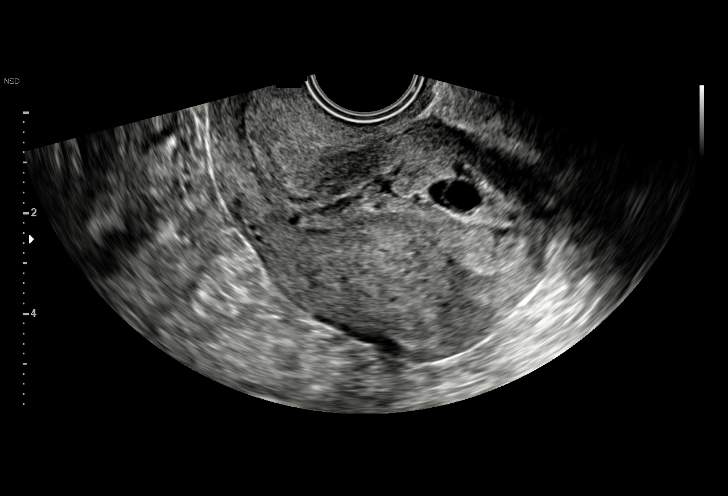

[15 of 28 positions shown; findings below may reference images not displayed]

FINDINGS: Intrauterine gestational sac: Single

Yolk sac:  Enlarged yolk sac versus amnion

Embryo:  Not Visualized.

MSD: 10  mm   5 w   5  d

Subchorionic hemorrhage:  Moderate subchorionic hemorrhage.

Maternal uterus/adnexae: Retroverted uterus. Normal appearance of
both ovaries. No adnexal mass or free fluid identified.
IMPRESSION: Single intrauterine gestational sac, with features suspicious but
not yet definitive for failed pregnancy. Recommend follow-up US in
10-14 days for definitive diagnosis. This recommendation follows SRU
consensus guidelines: Diagnostic Criteria for Nonviable Pregnancy
Early in the First Trimester. N Engl J Med 6281; [DATE].

## 2020-02-01 ENCOUNTER — Emergency Department (HOSPITAL_COMMUNITY): Payer: BLUE CROSS/BLUE SHIELD

## 2020-02-01 ENCOUNTER — Emergency Department (HOSPITAL_COMMUNITY)
Admission: EM | Admit: 2020-02-01 | Discharge: 2020-02-01 | Disposition: A | Discharge status: Home / Self Care | Payer: BLUE CROSS/BLUE SHIELD | Attending: Emergency Medicine | Admitting: Emergency Medicine

## 2020-02-01 ENCOUNTER — Other Ambulatory Visit: Payer: Self-pay

## 2020-02-01 ENCOUNTER — Encounter (HOSPITAL_COMMUNITY): Payer: Self-pay

## 2020-02-01 DIAGNOSIS — R112 Nausea with vomiting, unspecified: Secondary | ICD-10-CM

## 2020-02-01 DIAGNOSIS — S060X1A Concussion with loss of consciousness of 30 minutes or less, initial encounter: Secondary | ICD-10-CM | POA: Insufficient documentation

## 2020-02-01 DIAGNOSIS — W01198A Fall on same level from slipping, tripping and stumbling with subsequent striking against other object, initial encounter: Secondary | ICD-10-CM | POA: Diagnosis not present

## 2020-02-01 DIAGNOSIS — Y92002 Bathroom of unspecified non-institutional (private) residence single-family (private) house as the place of occurrence of the external cause: Secondary | ICD-10-CM | POA: Insufficient documentation

## 2020-02-01 DIAGNOSIS — S0990XA Unspecified injury of head, initial encounter: Secondary | ICD-10-CM | POA: Diagnosis present

## 2020-02-01 DIAGNOSIS — R202 Paresthesia of skin: Secondary | ICD-10-CM | POA: Insufficient documentation

## 2020-02-01 DIAGNOSIS — M542 Cervicalgia: Secondary | ICD-10-CM | POA: Diagnosis not present

## 2020-02-01 LAB — I-STAT BETA HCG BLOOD, ED (MC, WL, AP ONLY): I-stat hCG, quantitative: <5 m[IU]/mL (ref <5)

## 2020-02-01 MED ORDER — SODIUM CHLORIDE 0.9 % IV BOLUS
1000.0000 mL | Freq: Once | INTRAVENOUS | Status: AC
  Administered 2020-02-01: 1000 mL via INTRAVENOUS

## 2020-02-01 MED ORDER — PROCHLORPERAZINE EDISYLATE 10 MG/2ML IJ SOLN
10.0000 mg | Freq: Once | INTRAMUSCULAR | Status: AC
  Administered 2020-02-01: 10 mg via INTRAVENOUS
  Filled 2020-02-01: qty 2

## 2020-02-01 MED ORDER — DIPHENHYDRAMINE HCL 50 MG/ML IJ SOLN
25.0000 mg | Freq: Once | INTRAMUSCULAR | Status: AC
Start: 2020-02-01 — End: 2020-02-01
  Administered 2020-02-01: 25 mg via INTRAVENOUS
  Filled 2020-02-01: qty 1

## 2020-02-01 MED ORDER — PROCHLORPERAZINE MALEATE 10 MG PO TABS: 10.0000 mg | ORAL_TABLET | Freq: Three times a day (TID) | ORAL | 0 refills | Status: AC | PRN

## 2020-02-01 NOTE — ED Notes (Signed)
Pt given water 

## 2020-02-01 NOTE — ED Notes (Signed)
Pt discharged from this ED in stable condition at this time. All discharge instructions and follow up care reviewed with pt with no further questions at this time. Pt ambulatory with steady gait, clear speech.  

## 2020-02-01 NOTE — Discharge Instructions (Signed)
Your history and exam today are consistent with severe concussion from this fall hitting your head.  We ended up doing imaging of your head and neck including an MRI and did not see evidence of cord injury or neuro injury.  I suspect some of the transient numbness and tingling in her right arm is related to the concussion.  Please use the medicine to help with the headache and nausea and maintain hydration.  Please rest.  Please follow-up with the concussion clinic for further management.  If any symptoms change or worsen or you start having nausea and vomiting again and cannot maintain hydration, please return to the nearest emergency department.  If you start getting anxious with the Compazine, please take some Benadryl.  Please rest.

## 2020-02-01 NOTE — ED Triage Notes (Signed)
Patient states she tripped while in the bathroom3 days ago. Patient c/o headache, emesis, and dizziness.

## 2020-02-01 NOTE — ED Provider Notes (Signed)
[facility]-EMERGENCY DEPT Provider Note   CSN: [id number] Arrival date & time: 02/01/20  1052     History Chief Complaint  Patient presents with  . Headache  . Emesis  . Dizziness  . Fall    Jamie Ponce is a 34 y.o. female.  The history is provided by the patient and medical records. No language interpreter was used.  Fall This is a new problem. The current episode started more than 2 days ago. The problem occurs constantly. The problem has not changed since onset.Associated symptoms include headaches. Pertinent negatives include no chest pain, no abdominal pain and no shortness of breath. Nothing aggravates the symptoms. Nothing relieves the symptoms. Treatments tried: aleve. The treatment provided no relief.       Past Medical History:  Diagnosis Date  . Ovarian cyst     There are no problems to display for this patient.   Past Surgical History:  Procedure Laterality Date  . NO PAST SURGERIES       OB History    Gravida  3   Para  2   Term  2   Preterm      AB  1   Living  2     SAB  1   IAB      Ectopic      Multiple      Live Births              Family History  Problem Relation Age of Onset  . Heart disease Mother   . Diabetes Father     Social History   Tobacco Use  . Smoking status: Never Smoker  . Smokeless tobacco: Never Used  Vaping Use  . Vaping Use: Never used  Substance Use Topics  . Alcohol use: No  . Drug use: No    Home Medications Prior to Admission medications   Medication Sig Start Date End Date Taking? Authorizing Provider  ibuprofen (ADVIL,MOTRIN) 600 MG tablet Take 1 tablet (600 mg total) by mouth every 6 (six) hours as needed. Patient taking differently: Take 400 mg by mouth every 6 (six) hours as needed.  09/24/16   Aviva Signs, CNM  naproxen (NAPROSYN) 500 MG tablet Take 1 tablet (500 mg total) by mouth 2 (two) times daily. Patient not taking: Reported on 08/30/2017 08/18/17    Maxwell Caul, PA-C    Allergies    Shrimp [shellfish allergy]  Review of Systems   Review of Systems  Constitutional: Positive for fatigue. Negative for chills, diaphoresis and fever.  HENT: Negative for congestion.   Eyes: Negative for photophobia and visual disturbance.  Respiratory: Negative for cough, chest tightness, shortness of breath and wheezing.   Cardiovascular: Negative for chest pain.  Gastrointestinal: Positive for nausea and vomiting. Negative for abdominal pain, constipation and diarrhea.  Genitourinary: Negative for dysuria.  Musculoskeletal: Positive for neck pain. Negative for back pain and neck stiffness.  Skin: Negative for rash and wound.  Neurological: Positive for dizziness, light-headedness, numbness and headaches. Negative for seizures, syncope, speech difficulty and weakness.  Psychiatric/Behavioral: Negative for agitation and confusion.  All other systems reviewed and are negative.   Physical Exam Updated Vital Signs BP 109/71 (BP Location: Left Arm)   Pulse (!) 57   Temp 97.7 F (36.5 C) (Oral)   Resp 16   Ht 5' (1.524 m)   Wt 54.4 kg   LMP 01/02/2020 (Approximate)   SpO2 99%   BMI 23.44 kg/m  Physical Exam Vitals and nursing note reviewed.  Constitutional:      General: She is not in acute distress.    Appearance: She is well-developed and well-nourished. She is not ill-appearing, toxic-appearing or diaphoretic.  HENT:     Head: Normocephalic and atraumatic. No abrasion, contusion or laceration.      Right Ear: External ear normal.     Left Ear: External ear normal.     Nose: Nose normal.     Mouth/Throat:     Mouth: Oropharynx is clear and moist. Mucous membranes are moist.     Pharynx: Oropharynx is clear. No oropharyngeal exudate.  Eyes:     Extraocular Movements: Extraocular movements intact and EOM normal.     Right eye: Normal extraocular motion and no nystagmus.     Left eye: Normal extraocular motion and no nystagmus.      Conjunctiva/sclera: Conjunctivae normal.     Pupils: Pupils are equal, round, and reactive to light. Pupils are equal.  Neck:   Cardiovascular:     Rate and Rhythm: Normal rate.     Pulses: Normal pulses.     Heart sounds: No murmur heard.   Pulmonary:     Effort: Pulmonary effort is normal. No respiratory distress.     Breath sounds: No stridor. No wheezing, rhonchi or rales.  Chest:     Chest wall: No tenderness.  Abdominal:     General: Abdomen is flat. There is no distension.     Tenderness: There is no abdominal tenderness. There is no left CVA tenderness, guarding or rebound.  Musculoskeletal:        General: Tenderness present.     Cervical back: Normal range of motion and neck supple. Tenderness present. No rigidity. Spinous process tenderness and muscular tenderness present.     Right lower leg: No edema.     Left lower leg: No edema.  Skin:    General: Skin is warm.     Capillary Refill: Capillary refill takes less than 2 seconds.     Findings: No erythema or rash.  Neurological:     Mental Status: She is alert and oriented to person, place, and time.     Cranial Nerves: No cranial nerve deficit, dysarthria or facial asymmetry.     Sensory: Sensory deficit present.     Motor: No weakness, tremor, abnormal muscle tone or seizure activity.     Coordination: Coordination normal. Finger-Nose-Finger Test normal.     Deep Tendon Reflexes: Reflexes are normal and symmetric.     Comments: Numbness in right hand compared to left.  Intact grip strength and pulses.  Normal finger-nose-finger testing.  Psychiatric:        Mood and Affect: Mood normal.     ED Results / Procedures / Treatments   Labs (all labs ordered are listed, but only abnormal results are displayed) Labs Reviewed  I-STAT BETA HCG BLOOD, ED ([facility], [facility], [facility] ONLY)    EKG None  Radiology CT Head Wo Contrast  Result Date: 02/01/2020 CLINICAL DATA:  Recent fall with nausea and vomiting, headache EXAM: CT  HEAD WITHOUT CONTRAST TECHNIQUE: Contiguous axial images were obtained from the base of the skull through the vertex without intravenous contrast. COMPARISON:  None. FINDINGS: Brain: There is no acute intracranial hemorrhage, mass effect, or edema. Gray-white differentiation is preserved. There is no extra-axial fluid collection. Ventricles and sulci are within normal limits in size and configuration. Vascular: No hyperdense vessel or unexpected calcification. Skull: Calvarium is unremarkable.  Sinuses/Orbits: No acute finding. Other: None. IMPRESSION: No evidence of acute intracranial injury. Electronically Signed   By: Guadlupe Spanish M.D.   On: 02/01/2020 12:33   CT Cervical Spine Wo Contrast  Result Date: 02/01/2020 CLINICAL DATA:  Recent fall, right arm numbness EXAM: CT CERVICAL SPINE WITHOUT CONTRAST TECHNIQUE: Multidetector CT imaging of the cervical spine was performed without intravenous contrast. Multiplanar CT image reconstructions were also generated. COMPARISON:  None. FINDINGS: Alignment: Preserved. Skull base and vertebrae: No acute cervical spine fracture. Vertebral body heights are maintained. Soft tissues and spinal canal: No prevertebral fluid or swelling. No visible canal hematoma. Disc levels:  Intervertebral disc heights are preserved. Upper chest: Negative. Other: None. IMPRESSION: No acute cervical spine fracture. Electronically Signed   By: Guadlupe Spanish M.D.   On: 02/01/2020 12:37   MR Cervical Spine Wo Contrast  Result Date: 02/01/2020 CLINICAL DATA:  Fall, right hand numbness EXAM: MRI CERVICAL SPINE WITHOUT CONTRAST TECHNIQUE: Multiplanar, multisequence MR imaging of the cervical spine was performed. No intravenous contrast was administered. COMPARISON:  None. FINDINGS: Alignment: Physiologic. Vertebrae: Vertebral body heights are maintained. There is no marrow edema. No suspicious osseous lesion. Cord: No abnormal signal. Posterior Fossa, vertebral arteries, paraspinal  tissues: No prevertebral edema. No evidence of soft tissue or ligamentous injury. Included posterior fossa is unremarkable. Vertebral artery flow voids are preserved. Disc levels: Trace disc bulges from C3-C4 through C6-C7. No canal or foraminal stenosis. IMPRESSION: No significant abnormality. Electronically Signed   By: Guadlupe Spanish M.D.   On: 02/01/2020 14:06    Procedures Procedures (including critical care time)  Medications Ordered in ED Medications  prochlorperazine (COMPAZINE) injection 10 mg (10 mg Intravenous Given 02/01/20 1253)  diphenhydrAMINE (BENADRYL) injection 25 mg (25 mg Intravenous Given 02/01/20 1253)  sodium chloride 0.9 % bolus 1,000 mL (0 mLs Intravenous Stopped 02/01/20 1656)    ED Course  I have reviewed the triage vital signs and the nursing notes.  Pertinent labs & imaging results that were available during my care of the patient were reviewed by me and considered in my medical decision making (see chart for details).    MDM Rules/Calculators/A&P                          Jamie Ponce is a 35 y.o. female with no significant past medical history who presents with nausea, vomiting, dizziness, right arm numbness, neck pain, and headache after fall.  Patient reports that on Saturday night, 4 days ago, patient was walking in her bathroom and had a mechanical fall and hit her head.  She thinks she was knocked out for several minutes.  She reports she has had constant headache since.  She reports is severe.  She reports no vision changes or speech difficulties but report nausea and vomiting.  She also reports some neck pain and is having some numbness in her right arm which is new.  She reports that when she tries to stand she gets lightheaded and dizzy is never felt this before.  She denies any history of intracranial injury or concussions.  No history of numbness in extremities.  She denies any chest pain, shortness of breath, or abdominal pain.  She denies constipation,  diarrhea, or urinary symptoms.  She denies other complaints.  She reports Aleve only helped minimally.  She reports feeling very tired and sleepy.  On exam, patient does report some numbness in her right hand compared to left.  Intact grip  strength.  Intact pulses.  Normal finger-nose-finger testing, and sensation and strength in lower extremities.  Pupils are symmetric reactive normal extraocular movements.  Clear speech.  No facial droop.  No facial numbness.  Tenderness present across her neck.  Some tenderness on her crown and frontal skull.  Exam otherwise unremarkable.  Clinically, I do feel need to rule out intracranial hemorrhage or cervical spine injury.  We will get CT of the head and neck initially.  If images complete reassuring, given the numbness in her right arm, may consider MRI of her C-spine.  We will get imaging and then give her headache cocktail to help with her discomfort.  She does not feel she is pregnant but is still menstrual cycles.  We will get pregnancy test to prove she is not pregnant.  She denies any preceding symptoms such as fevers, chills congestion, cough and denies any Covid symptoms.  She reports this is a mechanical fall no preceding symptoms.  Anticipate reassessment after imaging and management  12:47 PM CT head and C-spine did not show evidence of intracranial hemorrhage or fracture.  Suspect postconcussive syndrome however given the persistent numbness in her right hand, will get MRI of her neck to look for a central cord syndrome or neural injury.  Will give headache cocktail.  She is not pregnant.  Patient agrees with plan, anticipate reassessment after MRI.  MRI showed no evidence of cord injury.  Suspect related to concussion and head injury.    She was feeling better after headache medications.  Headache went from ten to a 3 out of 10 and is very manageable.  Patient was able to tolerate p.o. and ate and drank without nausea and vomiting.  Patient  discharged with prescription for Compazine instructions take Benadryl if she has akathisia.  She will follow up with concussion clinic and PCP.  She understands return precautions and follow-up instructions.  Patient discharged in good condition with improved headache.  Final Clinical Impression(s) / ED Diagnoses Final diagnoses:  Concussion with loss of consciousness of 30 minutes or less, initial encounter  Injury of head, initial encounter  Non-intractable vomiting with nausea, unspecified vomiting type  Paresthesia of right arm    Rx / DC Orders ED Discharge Orders         Ordered    prochlorperazine (COMPAZINE) 10 MG tablet  Every 8 hours PRN        02/01/20 1638         Clinical Impression: 1. Concussion with loss of consciousness of 30 minutes or less, initial encounter   2. Injury of head, initial encounter   3. Non-intractable vomiting with nausea, unspecified vomiting type   4. Paresthesia of right arm     Disposition: Discharge  Condition: Good  I have discussed the results, Dx and Tx plan with the pt(& family if present). He/she/they expressed understanding and agree(s) with the plan. Discharge instructions discussed at great length. Strict return precautions discussed and pt &/or family have verbalized understanding of the instructions. No further questions at time of discharge.    Discharge Medication List as of 02/01/2020  4:41 PM    START taking these medications   Details  prochlorperazine (COMPAZINE) 10 MG tablet Take 1 tablet (10 mg total) by mouth every 8 (eight) hours as needed for nausea or vomiting., Starting Tue 02/01/2020, Print        Follow Up: Judi Saa, DO [address] [place] [address] [phone]   for concussion  clinic  [facility] [address] [id number] [place] [address] [phone]       Latashia Koch, Canary Brim, MD 02/01/20 [phone]

## 2021-10-29 ENCOUNTER — Encounter (HOSPITAL_BASED_OUTPATIENT_CLINIC_OR_DEPARTMENT_OTHER): Payer: Self-pay | Admitting: Emergency Medicine

## 2021-10-29 ENCOUNTER — Emergency Department (HOSPITAL_BASED_OUTPATIENT_CLINIC_OR_DEPARTMENT_OTHER)
Admission: EM | Admit: 2021-10-29 | Discharge: 2021-10-30 | Disposition: A | Discharge status: Home / Self Care | Payer: BLUE CROSS/BLUE SHIELD | Attending: Emergency Medicine | Admitting: Emergency Medicine

## 2021-10-29 ENCOUNTER — Other Ambulatory Visit: Payer: Self-pay

## 2021-10-29 DIAGNOSIS — R0600 Dyspnea, unspecified: Secondary | ICD-10-CM | POA: Diagnosis not present

## 2021-10-29 DIAGNOSIS — R0602 Shortness of breath: Secondary | ICD-10-CM | POA: Insufficient documentation

## 2021-10-29 DIAGNOSIS — F419 Anxiety disorder, unspecified: Secondary | ICD-10-CM | POA: Insufficient documentation

## 2021-10-29 HISTORY — DX: Anxiety disorder, unspecified: F41.9

## 2021-10-29 LAB — BASIC METABOLIC PANEL
Anion gap: 11 (ref 5–15)
BUN: 15 mg/dL (ref 6–20)
CO2: 18 mmol/L — ABNORMAL LOW (ref 22–32)
Calcium: 9.6 mg/dL (ref 8.9–10.3)
Chloride: 109 mmol/L (ref 98–111)
Creatinine, Ser: 0.56 mg/dL (ref 0.44–1.00)
GFR, Estimated: >60 mL/min (ref >60)
Glucose, Bld: 95 mg/dL (ref 70–99)
Potassium: 3.3 mmol/L — ABNORMAL LOW (ref 3.5–5.1)
Sodium: 138 mmol/L (ref 135–145)

## 2021-10-29 LAB — CBC
HCT: 39.2 % (ref 36.0–46.0)
Hemoglobin: 13.6 g/dL (ref 12.0–15.0)
MCH: 30.1 pg (ref 26.0–34.0)
MCHC: 34.7 g/dL (ref 30.0–36.0)
MCV: 86.7 fL (ref 80.0–100.0)
Platelets: 294 K/uL (ref 150–400)
RBC: 4.52 MIL/uL (ref 3.87–5.11)
RDW: 12.2 % (ref 11.5–15.5)
WBC: 7.7 K/uL (ref 4.0–10.5)
nRBC: 0 % (ref 0.0–0.2)

## 2021-10-29 LAB — PREGNANCY, URINE: Preg Test, Ur: NEGATIVE

## 2021-10-29 LAB — TROPONIN I (HIGH SENSITIVITY): Troponin I (High Sensitivity): <2 ng/L (ref <18)

## 2021-10-29 NOTE — ED Triage Notes (Signed)
Patient arrived via EMS c/o anxiety x today. Patient states anxiety, some chest pressure radiation over left shoulder, with nausea. Patient is AO x 4, VS w/ elevated BP, unable to stand at this time.

## 2021-10-30 NOTE — Discharge Instructions (Signed)
Follow-up with primary doctor and return to the emergency department if symptoms recur.

## 2021-10-30 NOTE — ED Provider Notes (Signed)
[facility] EMERGENCY DEPARTMENT Provider Note   CSN: [id number] Arrival date & time: 10/29/21  2150     History  Chief Complaint  Patient presents with   Anxiety    Jamie Ponce is a 36 y.o. female.  Patient is a 36 year old female with no significant past medical history.  Patient presenting today with complaints of shortness of breath.  Patient does nails at work.  When she returned home today, she stated that she began feeling short of breath.  She reports feeling as though she could not get enough air and hyperventilated.  She describes numbness in her hands and throughout her body which seem to resolve shortly after arriving here.  She denies any chest pain, fevers, chills, or cough.  Upon my evaluation, patient is back to baseline and has no complaints.  She reports 1 or 2 similar episodes in the past for which she has not sought medical attention.  The history is provided by the patient.       Home Medications Prior to Admission medications   Medication Sig Start Date End Date Taking? Authorizing Provider  prochlorperazine (COMPAZINE) 10 MG tablet Take 1 tablet (10 mg total) by mouth every 8 (eight) hours as needed for nausea or vomiting. 02/01/20   Tegeler, Canary Brim, MD      Allergies    Shrimp [shellfish allergy]    Review of Systems   Review of Systems  All other systems reviewed and are negative.   Physical Exam Updated Vital Signs BP (!) 179/78 (BP Location: Left Arm)   Pulse 61   Temp 98 F (36.7 C)   Resp 18   Ht 5' (1.524 m)   Wt 58.1 kg   SpO2 100%   BMI 25.00 kg/m  Physical Exam Vitals and nursing note reviewed.  Constitutional:      General: She is not in acute distress.    Appearance: She is well-developed. She is not diaphoretic.  HENT:     Head: Normocephalic and atraumatic.  Cardiovascular:     Rate and Rhythm: Normal rate and regular rhythm.     Heart sounds: No murmur heard.    No friction rub. No gallop.  Pulmonary:      Effort: Pulmonary effort is normal. No respiratory distress.     Breath sounds: Normal breath sounds. No wheezing.  Abdominal:     General: Bowel sounds are normal. There is no distension.     Palpations: Abdomen is soft.     Tenderness: There is no abdominal tenderness.  Musculoskeletal:        General: Normal range of motion.     Cervical back: Normal range of motion and neck supple.  Skin:    General: Skin is warm and dry.  Neurological:     General: No focal deficit present.     Mental Status: She is alert and oriented to person, place, and time.     ED Results / Procedures / Treatments   Labs (all labs ordered are listed, but only abnormal results are displayed) Labs Reviewed  BASIC METABOLIC PANEL - Abnormal; Notable for the following components:      Result Value   Potassium 3.3 (*)    CO2 18 (*)    All other components within normal limits  CBC  PREGNANCY, URINE  TROPONIN I (HIGH SENSITIVITY)  TROPONIN I (HIGH SENSITIVITY)    EKG EKG Interpretation  Date/Time:  Monday October 29 2021 22:03:36 EDT Ventricular Rate:  57 PR  Interval:  114 QRS Duration: 78 QT Interval:  420 QTC Calculation: 408 R Axis:   84 Text Interpretation: Sinus bradycardia Otherwise normal ECG Confirmed by Veryl Speak [phone]) on 10/30/2021 12:05:41 AM  Radiology No results found.  Procedures Procedures    Medications Ordered in ED Medications - No data to display  ED Course/ Medical Decision Making/ A&P  Patient presenting here with complaints of shortness of breath that I believe is most consistent with anxiety/panic attack.  Her symptoms have completely resolved.  EKG is normal and laboratory studies are all unremarkable.  I highly doubt any cardiac etiology and feel as though patient can safely be discharged.  Final Clinical Impression(s) / ED Diagnoses Final diagnoses:  None    Rx / DC Orders ED Discharge Orders     None         Veryl Speak, MD 10/30/21  5427

## 2022-07-31 IMAGING — MR MR CERVICAL SPINE W/O CM
6 series · 36 of 48 positions shown · non-contrast
Comparison: None.

CLINICAL DATA: Fall, right hand numbness

EXAM:
MRI CERVICAL SPINE WITHOUT CONTRAST
TECHNIQUE: Multiplanar, multisequence MR imaging of the cervical spine was
performed. No intravenous contrast was administered.

[Series 5: T1 · sagittal · 3.0mm · 0.59mm/px · 6 of 15 slices shown (1 of 2)]
[im 1/15]
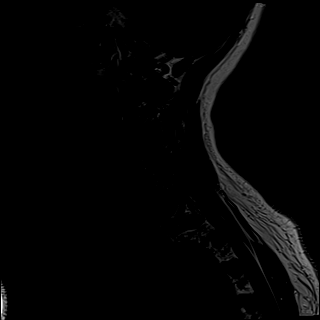
[im 3/15]
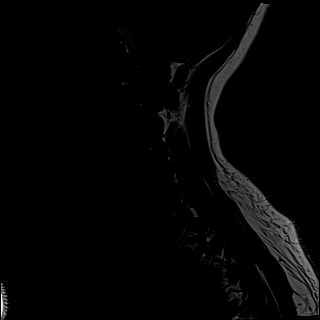
[im 6/15]
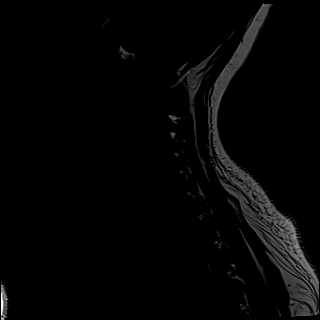
[im 9/15]
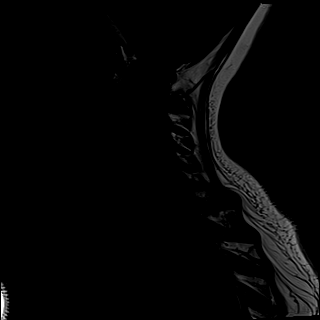
[im 12/15]
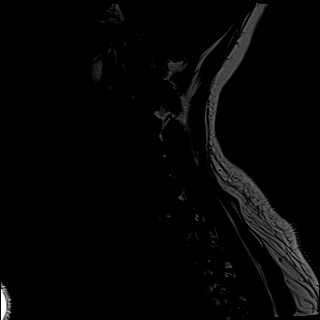
[im 15/15]
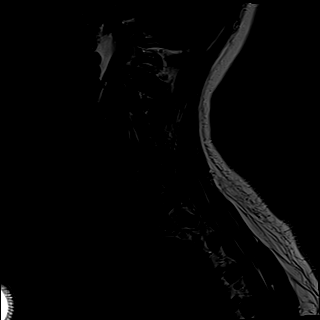

[Series 6: T2 · sagittal · 3.0mm · 0.59mm/px · 6 of 15 slices shown (1 of 2)]
[im 1/15]
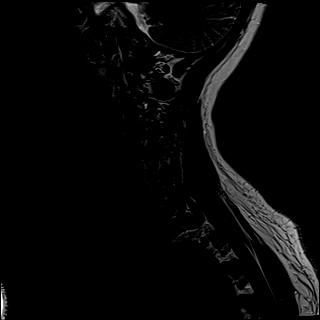
[im 3/15]
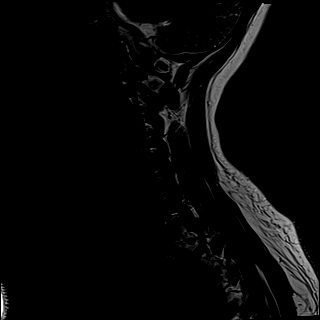
[im 6/15]
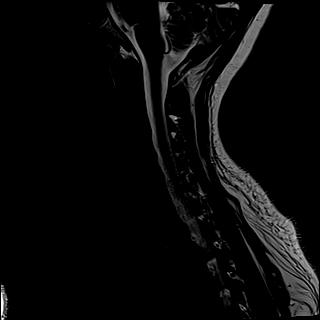
[im 9/15]
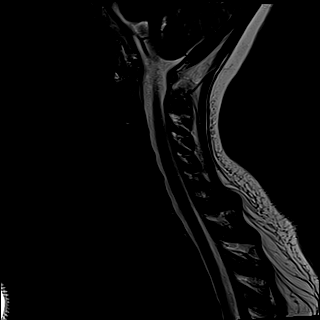
[im 12/15]
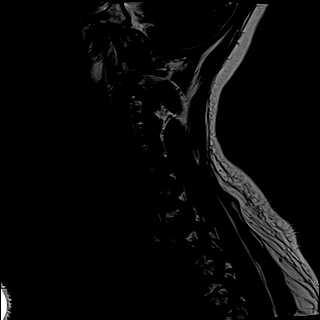
[im 15/15]
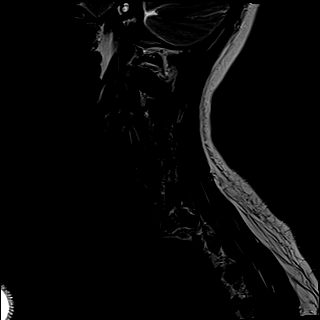

[Series 7: STIR · sagittal · 3.0mm · 0.74mm/px · 6 of 15 slices shown]
[im 1/15]
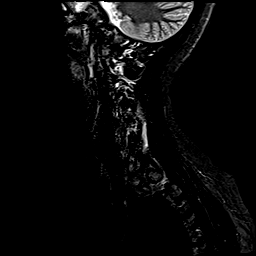
[im 3/15]
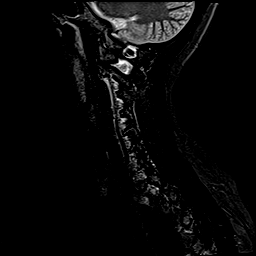
[im 6/15]
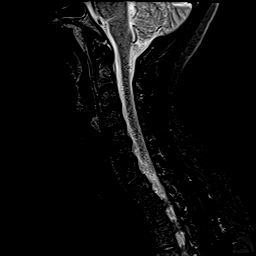
[im 9/15]
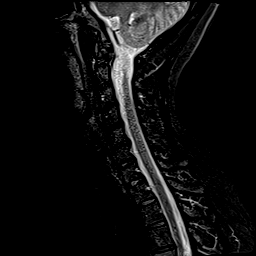
[im 12/15]
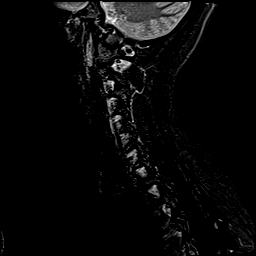
[im 15/15]
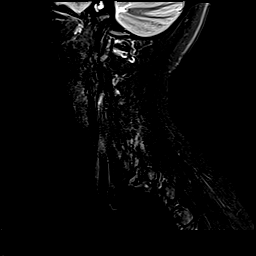

[Series 8: T2 · axial · 3.0mm · 0.70mm/px · z∈[-25,+63]mm · 8 of 27 slices shown (2 of 2)]
[im 1/27]
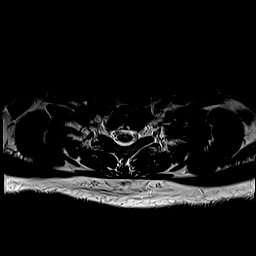
[im 3/27]
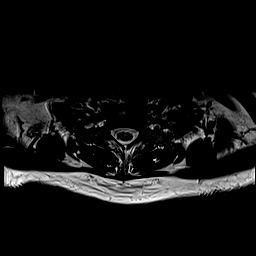
[im 9/27]
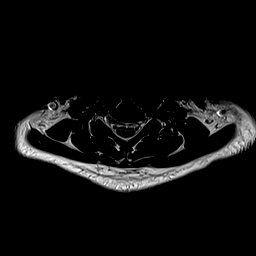
[im 12/27]
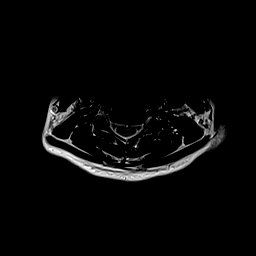
[im 15/27]
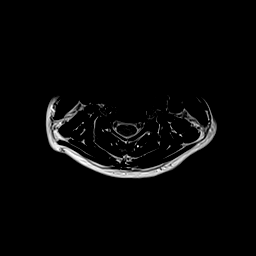
[im 18/27]
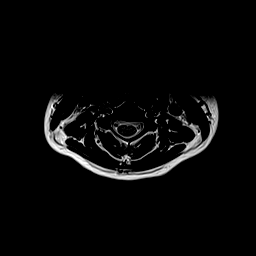
[im 24/27]
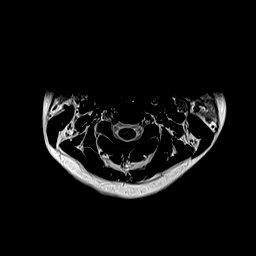
[im 27/27]
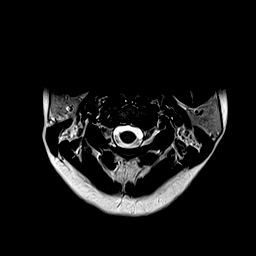

[Series 9: GRE · axial · 3.0mm · 0.35mm/px · z∈[-25,-18]mm · 2 of 27 slices shown]
[im 1/27]
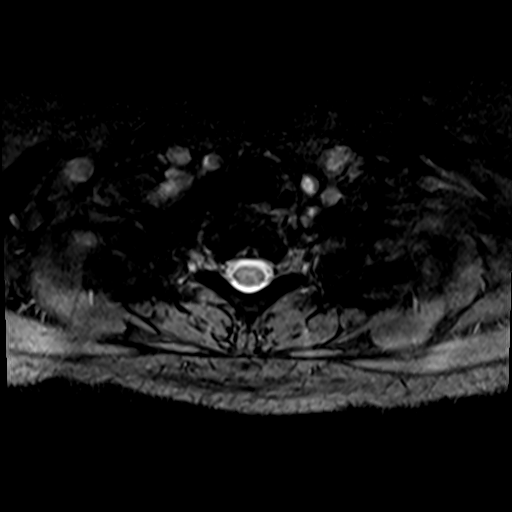
[im 3/27]
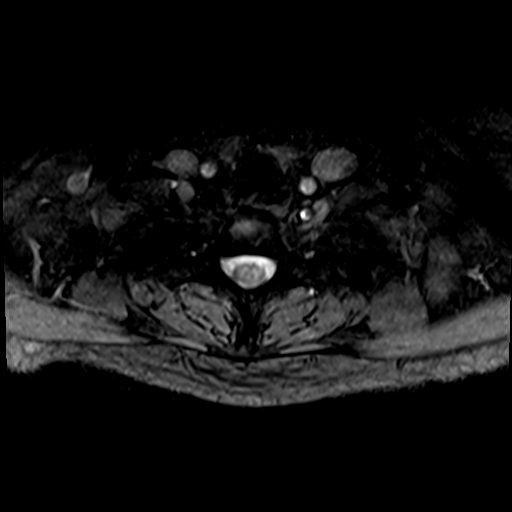

[Series 10: T1 · axial · 3.0mm · 0.35mm/px · z∈[-25,+63]mm · 8 of 27 slices shown (2 of 2)]
[im 1/27]
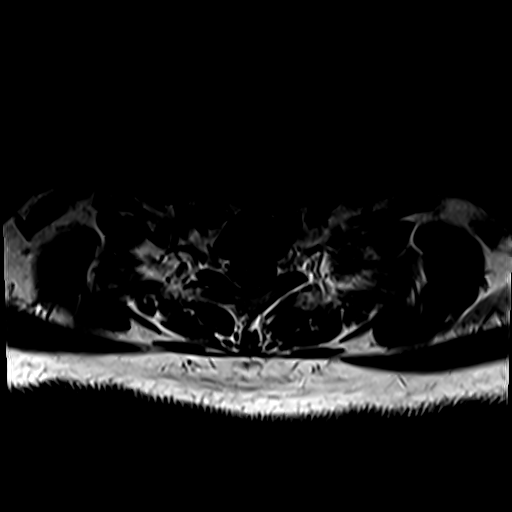
[im 3/27]
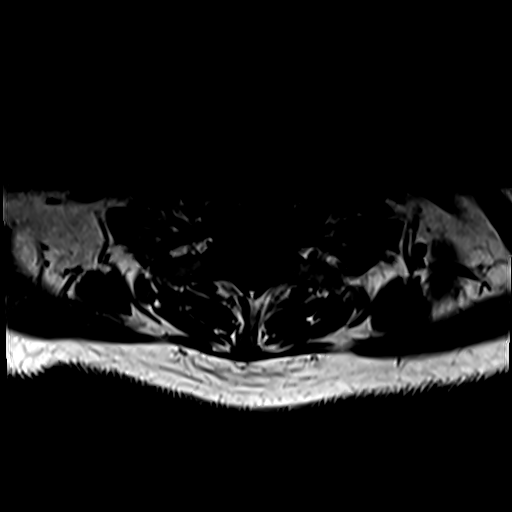
[im 9/27]
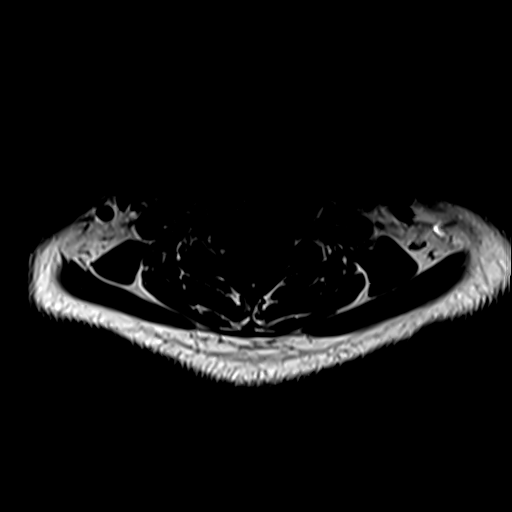
[im 12/27]
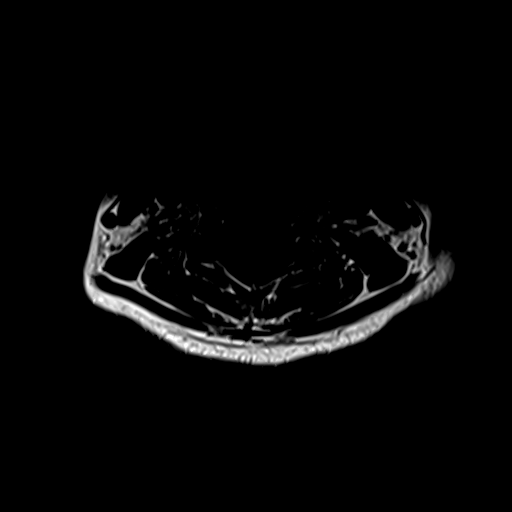
[im 15/27]
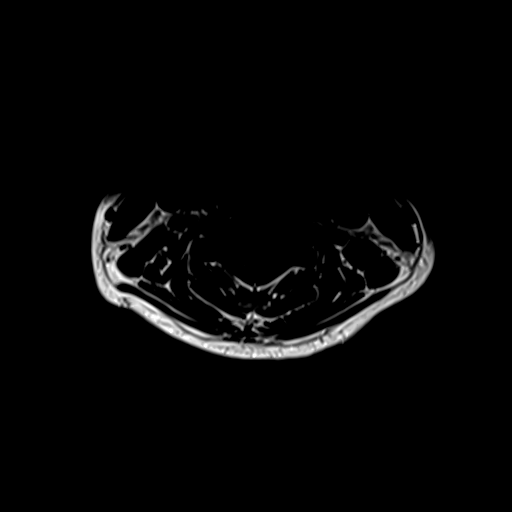
[im 18/27]
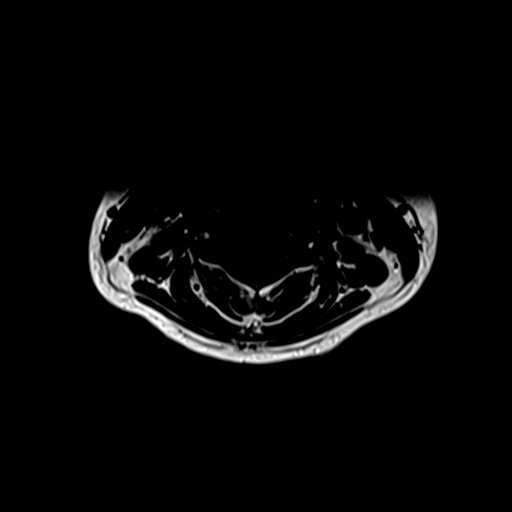
[im 24/27]
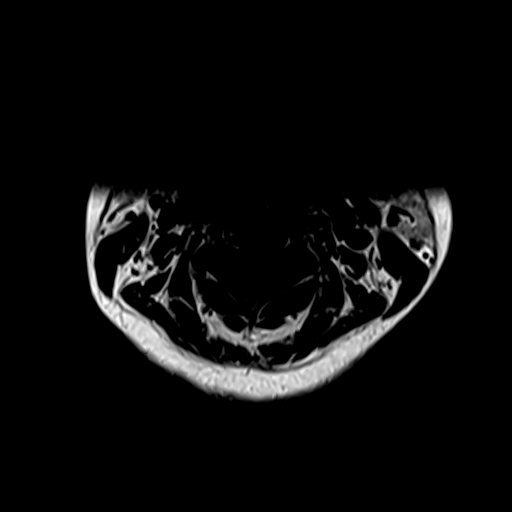
[im 27/27]
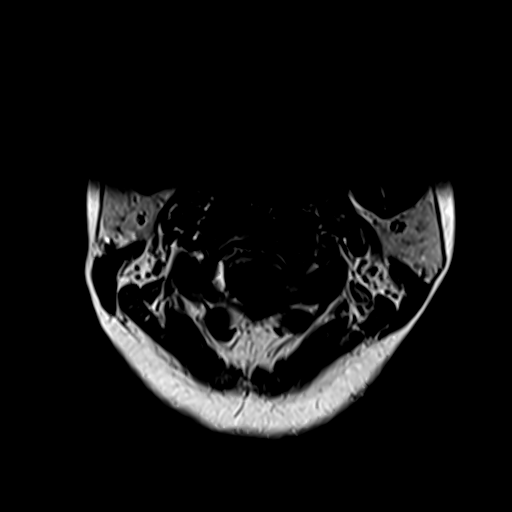

[36 of 48 positions shown; findings below may reference images not displayed]

FINDINGS: Alignment: Physiologic.

Vertebrae: Vertebral body heights are maintained. There is no marrow
edema. No suspicious osseous lesion.

Cord: No abnormal signal.

Posterior Fossa, vertebral arteries, paraspinal tissues: No
prevertebral edema. No evidence of soft tissue or ligamentous
injury. Included posterior fossa is unremarkable. Vertebral artery
flow voids are preserved.

Disc levels: Trace disc bulges from C3-C4 through C6-C7. No canal or
foraminal stenosis.
IMPRESSION: No significant abnormality.
